# Patient Record
Sex: Female | Born: 1980 | Race: White | Hispanic: No | Marital: Married | Smoking: Current every day smoker
Health system: Southern US, Community
[De-identification: ages and names within clinical notes are randomized; demographics above are authoritative.]

## PROBLEM LIST (undated history)

## (undated) ENCOUNTER — Inpatient Hospital Stay (HOSPITAL_COMMUNITY): Payer: Self-pay

## (undated) ENCOUNTER — Inpatient Hospital Stay (HOSPITAL_COMMUNITY): Payer: Medicaid Other | Admitting: Obstetrics & Gynecology

## (undated) DIAGNOSIS — G43909 Migraine, unspecified, not intractable, without status migrainosus: Secondary | ICD-10-CM

## (undated) HISTORY — PX: CHOLECYSTECTOMY: SHX55

---

## 2013-05-06 ENCOUNTER — Emergency Department (HOSPITAL_BASED_OUTPATIENT_CLINIC_OR_DEPARTMENT_OTHER)
Admission: EM | Admit: 2013-05-06 | Discharge: 2013-05-06 | Disposition: A | Discharge status: Home / Self Care | Payer: Self-pay | Attending: Emergency Medicine | Admitting: Emergency Medicine

## 2013-05-06 ENCOUNTER — Encounter (HOSPITAL_BASED_OUTPATIENT_CLINIC_OR_DEPARTMENT_OTHER): Payer: Self-pay | Admitting: Emergency Medicine

## 2013-05-06 ENCOUNTER — Emergency Department (HOSPITAL_BASED_OUTPATIENT_CLINIC_OR_DEPARTMENT_OTHER): Payer: Self-pay

## 2013-05-06 DIAGNOSIS — G43909 Migraine, unspecified, not intractable, without status migrainosus: Secondary | ICD-10-CM | POA: Insufficient documentation

## 2013-05-06 DIAGNOSIS — Z79899 Other long term (current) drug therapy: Secondary | ICD-10-CM | POA: Insufficient documentation

## 2013-05-06 DIAGNOSIS — F17200 Nicotine dependence, unspecified, uncomplicated: Secondary | ICD-10-CM | POA: Insufficient documentation

## 2013-05-06 HISTORY — DX: Migraine, unspecified, not intractable, without status migrainosus: G43.909

## 2013-05-06 MED ORDER — HYDROCODONE-ACETAMINOPHEN 5-325 MG PO TABS: 1.0000 | ORAL_TABLET | ORAL | Status: DC | PRN

## 2013-05-06 MED ORDER — METOCLOPRAMIDE HCL 5 MG/ML IJ SOLN
10.0000 mg | Freq: Once | INTRAMUSCULAR | Status: AC
  Administered 2013-05-06: 10 mg via INTRAMUSCULAR
  Filled 2013-05-06: qty 2

## 2013-05-06 MED ORDER — KETOROLAC TROMETHAMINE 30 MG/ML IJ SOLN
30.0000 mg | Freq: Once | INTRAMUSCULAR | Status: DC
  Filled 2013-05-06: qty 1

## 2013-05-06 MED ORDER — KETOROLAC TROMETHAMINE 30 MG/ML IJ SOLN
30.0000 mg | Freq: Once | INTRAMUSCULAR | Status: AC
  Administered 2013-05-06: 30 mg via INTRAMUSCULAR

## 2013-05-06 MED ORDER — ONDANSETRON 4 MG PO TBDP
4.0000 mg | ORAL_TABLET | Freq: Once | ORAL | Status: AC
  Administered 2013-05-06: 4 mg via ORAL
  Filled 2013-05-06: qty 1

## 2013-05-06 MED ORDER — SUMATRIPTAN SUCCINATE 25 MG PO TABS: 25.0000 mg | ORAL_TABLET | ORAL | Status: DC | PRN

## 2013-05-06 MED ORDER — MORPHINE SULFATE 4 MG/ML IJ SOLN
8.0000 mg | Freq: Once | INTRAMUSCULAR | Status: AC
  Administered 2013-05-06: 8 mg via INTRAMUSCULAR
  Filled 2013-05-06 (×2): qty 2

## 2013-05-06 NOTE — ED Notes (Signed)
Patient states she has a one week history of a migraine headache.  States she has a history of headaches.  States one week ago when the headache started, it lasted for three days and resolved.  States the headache returned yesterday and has been associated with dizziness and vomiting.

## 2013-05-06 NOTE — ED Provider Notes (Addendum)
CSN: [id number]     Arrival date & time 05/06/13  1115 History   First MD Initiated Contact with Patient 05/06/13 1141     Chief Complaint  Patient presents with  . Migraine    HPI  Patient presents with headache for 3 days. She had headache week ago. Went away. She states it was "when the storm came through". She's had headaches for over 20 years described as "weather change headaches" in the left occipital and parietal. Not sinus related. He lateral left-sided throbbing retro-orbital left occiput. Slow in onset. It resolved. Recurred yesterday. This was again slow in onset. This is unusual for her and that her symptoms are worse although located in the same place in a similar nature they're more severe. No fever. No neck pain. She's never been evaluated by a physician. She states she has never been to the health care facility for headaches. She states she's never taken a prescription medication for her headaches. Strangely,  she has not tried any over-the-counter medication for this headache  Past Medical History  Diagnosis Date  . Migraine    Past Surgical History  Procedure Laterality Date  . Cholecystectomy     History reviewed. No pertinent family history. History  Substance Use Topics  . Smoking status: Current Some Day Smoker  . Smokeless tobacco: Not on file  . Alcohol Use: No   OB History   Grav Para Term Preterm Abortions TAB SAB Ect Mult Living                 Review of Systems  Constitutional: Negative for fever, chills, diaphoresis, appetite change and fatigue.  HENT: Negative for mouth sores, sore throat and trouble swallowing.   Eyes: Negative for visual disturbance.  Respiratory: Negative for cough, chest tightness, shortness of breath and wheezing.   Cardiovascular: Negative for chest pain.  Gastrointestinal: Negative for nausea, vomiting, abdominal pain, diarrhea and abdominal distention.  Endocrine: Negative for polydipsia, polyphagia and polyuria.   Genitourinary: Negative for dysuria, frequency and hematuria.  Musculoskeletal: Negative for gait problem.  Skin: Negative for color change, pallor and rash.  Neurological: Positive for headaches. Negative for dizziness, syncope and light-headedness.       Photosensitive  Hematological: Does not bruise/bleed easily.  Psychiatric/Behavioral: Negative for behavioral problems and confusion.    Allergies  Review of patient's allergies indicates no known allergies.  Home Medications   Current Outpatient Rx  Name  Route  Sig  Dispense  Refill  . HYDROcodone-acetaminophen (NORCO/VICODIN) 5-325 MG per tablet   Oral   Take 1 tablet by mouth every 4 (four) hours as needed for pain.   10 tablet   0   . ibuprofen (ADVIL,MOTRIN) 400 MG tablet   Oral   Take 400 mg by mouth every 6 (six) hours as needed for pain.         . SUMAtriptan (IMITREX) 25 MG tablet   Oral   Take 1 tablet (25 mg total) by mouth every 2 (two) hours as needed for migraine. May repeat in 2 hours if headache persists or recurs.   10 tablet   0    BP 120/70  Pulse 73  Temp(Src) 98.3 F (36.8 C)  Resp 20  Ht 5\' 10"  (1.778 m)  Wt 230 lb (104.327 kg)  BMI 33 kg/m2  SpO2 100%  LMP 04/27/2013 Physical Exam  Constitutional: She is oriented to person, place, and time. She appears well-developed and well-nourished. No distress.  HENT:  Head: Normocephalic.  Eyes: Conjunctivae are normal. Pupils are equal, round, and reactive to light. No scleral icterus.  Neck: Normal range of motion. Neck supple. No thyromegaly present.  Supple neck. No meningismus.  Cardiovascular: Normal rate and regular rhythm.  Exam reveals no gallop and no friction rub.   No murmur heard. Pulmonary/Chest: Effort normal and breath sounds normal. No respiratory distress. She has no wheezes. She has no rales.  Abdominal: Soft. Bowel sounds are normal. She exhibits no distension. There is no tenderness. There is no rebound.  Musculoskeletal:  Normal range of motion.  Neurological: She is alert and oriented to person, place, and time.  Normal neurological exam. Conversant. Cranial nerves intact symmetric.  Skin: Skin is warm and dry. No rash noted.  Psychiatric: She has a normal mood and affect. Her behavior is normal.    ED Course  Procedures (including critical care time) Labs Review Labs Reviewed - No data to display Imaging Review Ct Head Wo Contrast  05/06/2013   CLINICAL DATA:  Headache.  EXAM: CT HEAD WITHOUT CONTRAST  TECHNIQUE: Contiguous axial images were obtained from the base of the skull through the vertex without intravenous contrast.  COMPARISON:  None.  FINDINGS: Bony calvarium appears intact. No mass effect or midline shift is noted. Ventricular size is within normal limits. There is no evidence of mass lesion, hemorrhage or acute infarction.  IMPRESSION: No gross intracranial abnormality seen.   Electronically Signed   By: Roque Lias M.D.   On: 05/06/2013 12:45    EKG Interpretation   None     Sinus bradycardia.  No st/t changes.  No ectopy   MDM   1. Headache   2. Migraine    Normal imaging. Description of the headache is unilateral in her photosensitivity and indolent onset somewhat migraine. Did not have a high suspicion that this is vascular or subarachnoid hemorrhage. Afebrile, supple neck, not meningitis. Plan is home. Recheck with recurrence or evolving symptoms.    Roney Marion, MD 05/06/13 1407  Roney Marion, MD 05/08/13 [phone]

## 2013-05-06 NOTE — ED Notes (Signed)
Patient transported to CT 

## 2014-11-27 ENCOUNTER — Emergency Department (HOSPITAL_BASED_OUTPATIENT_CLINIC_OR_DEPARTMENT_OTHER)
Admission: EM | Admit: 2014-11-27 | Discharge: 2014-11-27 | Disposition: A | Discharge status: Home / Self Care | Payer: Medicaid Other | Attending: Emergency Medicine | Admitting: Emergency Medicine

## 2014-11-27 ENCOUNTER — Encounter (HOSPITAL_BASED_OUTPATIENT_CLINIC_OR_DEPARTMENT_OTHER): Payer: Self-pay

## 2014-11-27 DIAGNOSIS — R102 Pelvic and perineal pain: Secondary | ICD-10-CM | POA: Insufficient documentation

## 2014-11-27 DIAGNOSIS — N938 Other specified abnormal uterine and vaginal bleeding: Secondary | ICD-10-CM | POA: Diagnosis not present

## 2014-11-27 DIAGNOSIS — N939 Abnormal uterine and vaginal bleeding, unspecified: Secondary | ICD-10-CM

## 2014-11-27 DIAGNOSIS — Z3202 Encounter for pregnancy test, result negative: Secondary | ICD-10-CM | POA: Diagnosis not present

## 2014-11-27 DIAGNOSIS — Z720 Tobacco use: Secondary | ICD-10-CM | POA: Insufficient documentation

## 2014-11-27 DIAGNOSIS — Z8679 Personal history of other diseases of the circulatory system: Secondary | ICD-10-CM | POA: Diagnosis not present

## 2014-11-27 DIAGNOSIS — R109 Unspecified abdominal pain: Secondary | ICD-10-CM | POA: Diagnosis present

## 2014-11-27 DIAGNOSIS — Z9049 Acquired absence of other specified parts of digestive tract: Secondary | ICD-10-CM | POA: Insufficient documentation

## 2014-11-27 LAB — CBC WITH DIFFERENTIAL/PLATELET
Basophils Absolute: 0 10*3/uL (ref 0.0–0.1)
Basophils Relative: 0 % (ref 0–1)
EOS ABS: 0.1 10*3/uL (ref 0.0–0.7)
Eosinophils Relative: 2 % (ref 0–5)
HCT: 40.7 % (ref 36.0–46.0)
Hemoglobin: 13.5 g/dL (ref 12.0–15.0)
Lymphocytes Relative: 43 % (ref 12–46)
Lymphs Abs: 2.6 10*3/uL (ref 0.7–4.0)
MCH: 29.6 pg (ref 26.0–34.0)
MCHC: 33.2 g/dL (ref 30.0–36.0)
MCV: 89.3 fL (ref 78.0–100.0)
Monocytes Absolute: 0.4 10*3/uL (ref 0.1–1.0)
Monocytes Relative: 6 % (ref 3–12)
NEUTROS ABS: 2.9 10*3/uL (ref 1.7–7.7)
Neutrophils Relative %: 49 % (ref 43–77)
Platelets: 227 10*3/uL (ref 150–400)
RBC: 4.56 MIL/uL (ref 3.87–5.11)
RDW: 12.8 % (ref 11.5–15.5)
WBC: 6 10*3/uL (ref 4.0–10.5)

## 2014-11-27 LAB — URINE MICROSCOPIC-ADD ON

## 2014-11-27 LAB — COMPREHENSIVE METABOLIC PANEL
ALT: 14 U/L (ref 14–54)
AST: 16 U/L (ref 15–41)
Albumin: 4.3 g/dL (ref 3.5–5.0)
Alkaline Phosphatase: 29 U/L — ABNORMAL LOW (ref 38–126)
Anion gap: 9 (ref 5–15)
BUN: 11 mg/dL (ref 6–20)
CO2: 23 mmol/L (ref 22–32)
CREATININE: 0.82 mg/dL (ref 0.44–1.00)
Calcium: 8.7 mg/dL — ABNORMAL LOW (ref 8.9–10.3)
Chloride: 109 mmol/L (ref 101–111)
GFR calc Af Amer: >60 mL/min (ref >60)
GFR calc non Af Amer: >60 mL/min (ref >60)
GLUCOSE: 89 mg/dL (ref 65–99)
POTASSIUM: 3.7 mmol/L (ref 3.5–5.1)
SODIUM: 141 mmol/L (ref 135–145)
Total Bilirubin: 0.8 mg/dL (ref 0.3–1.2)
Total Protein: 7.4 g/dL (ref 6.5–8.1)

## 2014-11-27 LAB — URINALYSIS, ROUTINE W REFLEX MICROSCOPIC
GLUCOSE, UA: NEGATIVE mg/dL
KETONES UR: NEGATIVE mg/dL
Leukocytes, UA: NEGATIVE
NITRITE: NEGATIVE
Protein, ur: 30 mg/dL — AB
Specific Gravity, Urine: 1.028 (ref 1.005–1.030)
Urobilinogen, UA: 0.2 mg/dL (ref 0.0–1.0)
pH: 5.5 (ref 5.0–8.0)

## 2014-11-27 LAB — PREGNANCY, URINE: PREG TEST UR: NEGATIVE

## 2014-11-27 MED ORDER — KETOROLAC TROMETHAMINE 30 MG/ML IJ SOLN
30.0000 mg | Freq: Once | INTRAMUSCULAR | Status: DC
  Filled 2014-11-27: qty 1

## 2014-11-27 MED ORDER — KETOROLAC TROMETHAMINE 30 MG/ML IJ SOLN: 30.0000 mg | Freq: Once | INTRAMUSCULAR | Status: DC

## 2014-11-27 MED ORDER — IBUPROFEN 800 MG PO TABS: 800.0000 mg | ORAL_TABLET | Freq: Three times a day (TID) | ORAL | Status: DC

## 2014-11-27 MED ORDER — SODIUM CHLORIDE 0.9 % IV BOLUS (SEPSIS)
1000.0000 mL | Freq: Once | INTRAVENOUS | Status: AC
Start: 2014-11-27 — End: 2014-11-27
  Administered 2014-11-27: 1000 mL via INTRAVENOUS

## 2014-11-27 MED ORDER — KETOROLAC TROMETHAMINE 60 MG/2ML IM SOLN
60.0000 mg | Freq: Once | INTRAMUSCULAR | Status: AC
  Administered 2014-11-27: 60 mg via INTRAMUSCULAR
  Filled 2014-11-27: qty 2

## 2014-11-27 MED ORDER — MORPHINE SULFATE 4 MG/ML IJ SOLN
4.0000 mg | Freq: Once | INTRAMUSCULAR | Status: AC
  Administered 2014-11-27: 4 mg via INTRAVENOUS
  Filled 2014-11-27: qty 1

## 2014-11-27 NOTE — ED Notes (Addendum)
Pt given urine cup for sample collection, unable to collect urine, will reattempt when next able.

## 2014-11-27 NOTE — ED Notes (Signed)
Patient here with heavy irregular vaginal bleeding x 3 days. Reports that she started re-bleeding 4 days after regular menstrual cycle.  Also complains of severe lower abdominal pain

## 2014-11-27 NOTE — ED Provider Notes (Signed)
CSN: [id number]     Arrival date & time 11/27/14  1309 History  This chart was scribed for Haley Canalavid H Weda Baumgarner, MD by Abel PrestoKara Demonbreun, ED Scribe. This patient was seen in room MH10/MH10 and the patient's care was started at 3:28 PM.    Chief Complaint  Patient presents with  . Abdominal Pain    The history is provided by the patient. No language interpreter was used.   HPI Comments: Haley Greer is a 34 y.o. female  who presents to the Emergency Department complaining of irregular vaginal bleeding with onset 3 days ago. She is unsure of how many pads she has used since onset. Pt's LNMP ended 6 days ago. She notes associated pelvic pain and cramping. Pt had a D&C in December and reports some spotting post-menses each month since. Pt took a home pregnancy test with negative results. Pt is not on birth control currently.  Pt denies vaginal discharge and fever.  Past Medical History  Diagnosis Date  . Migraine    Past Surgical History  Procedure Laterality Date  . Cholecystectomy     No family history on file. History  Substance Use Topics  . Smoking status: Current Some Day Smoker  . Smokeless tobacco: Not on file  . Alcohol Use: No   OB History    No data available     Review of Systems  Constitutional: Negative for fever.  Genitourinary: Positive for vaginal bleeding and pelvic pain. Negative for vaginal discharge.      Allergies  Review of patient's allergies indicates no known allergies.  Home Medications   Prior to Admission medications   Medication Sig Start Date End Date Taking? Authorizing Provider  ibuprofen (ADVIL,MOTRIN) 400 MG tablet Take 400 mg by mouth every 6 (six) hours as needed for pain.    Historical Provider, MD   BP 100/61 mmHg  Pulse 55  Temp(Src) 98.1 F (36.7 C) (Oral)  Resp 18  Ht 5\' 11"  (1.803 m)  Wt 198 lb (89.812 kg)  BMI 27.63 kg/m2  SpO2 97%  LMP 11/21/2014 Physical Exam  Constitutional: She is oriented to person, place, and time.  She appears well-developed and well-nourished.  HENT:  Head: Normocephalic.  Eyes: Conjunctivae are normal.  Neck: Normal range of motion. Neck supple.  Cardiovascular: Normal rate, regular rhythm, normal heart sounds and intact distal pulses.  Exam reveals no friction rub.   No murmur heard. Pulmonary/Chest: Effort normal and breath sounds normal. No respiratory distress.  Abdominal: Soft. Bowel sounds are normal. There is tenderness.  Mild uterine tenderness  Genitourinary:  Blood in vaginal vault. Minimal uterine tenderness. No adnexal tenderness   Musculoskeletal: Normal range of motion.  Neurological: She is alert and oriented to person, place, and time.  Skin: Skin is warm and dry.  Psychiatric: She has a normal mood and affect. Her behavior is normal.  Nursing note and vitals reviewed.   ED Course  Procedures (including critical care time) DIAGNOSTIC STUDIES: Oxygen Saturation is 100% on room air, normal by my interpretation.    COORDINATION OF CARE: 3:33 PM Discussed treatment plan with patient at beside, the patient agrees with the plan and has no further questions at this time.   Labs Review Labs Reviewed  URINALYSIS, ROUTINE W REFLEX MICROSCOPIC - Abnormal; Notable for the following:    Color, Urine AMBER (*)    Hgb urine dipstick LARGE (*)    Bilirubin Urine SMALL (*)    Protein, ur 30 (*)  All other components within normal limits  COMPREHENSIVE METABOLIC PANEL - Abnormal; Notable for the following:    Calcium 8.7 (*)    Alkaline Phosphatase 29 (*)    All other components within normal limits  URINE MICROSCOPIC-ADD ON - Abnormal; Notable for the following:    Bacteria, UA MANY (*)    All other components within normal limits  PREGNANCY, URINE  CBC WITH DIFFERENTIAL/PLATELET    Imaging Review No results found.   EKG Interpretation None      MDM   Final diagnoses:  None   Haley Greer is a 34 y.o. female here with break through  bleeding. Hg stable at 13. Vitals stable. UCG neg. I think likely having cramps. Recommend OCP to help regulate cycle. Motrin prn cramps.   I personally performed the services described in this documentation, which was scribed in my presence. The recorded information has been reviewed and is accurate.    Haley Canalavid H Ajeet Casasola, MD 11/27/14 [phone]

## 2014-11-27 NOTE — Discharge Instructions (Signed)
Take motrin 800 mg every 6 hrs for pain.   You should be started on oral contraceptives to regulate your cycle.   See your GYN doctor.   Return to ER if you have severe pain, worse bleeding, vomiting, fever.

## 2015-01-15 ENCOUNTER — Emergency Department (HOSPITAL_BASED_OUTPATIENT_CLINIC_OR_DEPARTMENT_OTHER)
Admission: EM | Admit: 2015-01-15 | Discharge: 2015-01-15 | Disposition: A | Discharge status: Home / Self Care | Payer: Medicaid Other | Attending: Emergency Medicine | Admitting: Emergency Medicine

## 2015-01-15 ENCOUNTER — Encounter (HOSPITAL_BASED_OUTPATIENT_CLINIC_OR_DEPARTMENT_OTHER): Payer: Self-pay | Admitting: *Deleted

## 2015-01-15 ENCOUNTER — Emergency Department (HOSPITAL_BASED_OUTPATIENT_CLINIC_OR_DEPARTMENT_OTHER): Payer: Medicaid Other

## 2015-01-15 DIAGNOSIS — Z8679 Personal history of other diseases of the circulatory system: Secondary | ICD-10-CM | POA: Diagnosis not present

## 2015-01-15 DIAGNOSIS — O209 Hemorrhage in early pregnancy, unspecified: Secondary | ICD-10-CM

## 2015-01-15 DIAGNOSIS — O418X11 Other specified disorders of amniotic fluid and membranes, first trimester, fetus 1: Secondary | ICD-10-CM | POA: Diagnosis not present

## 2015-01-15 DIAGNOSIS — F17210 Nicotine dependence, cigarettes, uncomplicated: Secondary | ICD-10-CM | POA: Insufficient documentation

## 2015-01-15 DIAGNOSIS — Z3A01 Less than 8 weeks gestation of pregnancy: Secondary | ICD-10-CM | POA: Insufficient documentation

## 2015-01-15 DIAGNOSIS — O99331 Smoking (tobacco) complicating pregnancy, first trimester: Secondary | ICD-10-CM | POA: Diagnosis not present

## 2015-01-15 DIAGNOSIS — O468X1 Other antepartum hemorrhage, first trimester: Secondary | ICD-10-CM

## 2015-01-15 DIAGNOSIS — Z23 Encounter for immunization: Secondary | ICD-10-CM | POA: Insufficient documentation

## 2015-01-15 DIAGNOSIS — O418X10 Other specified disorders of amniotic fluid and membranes, first trimester, not applicable or unspecified: Secondary | ICD-10-CM

## 2015-01-15 DIAGNOSIS — Z791 Long term (current) use of non-steroidal anti-inflammatories (NSAID): Secondary | ICD-10-CM | POA: Diagnosis not present

## 2015-01-15 LAB — URINALYSIS, ROUTINE W REFLEX MICROSCOPIC
Bilirubin Urine: NEGATIVE
Glucose, UA: NEGATIVE mg/dL
Ketones, ur: NEGATIVE mg/dL
Leukocytes, UA: NEGATIVE
Nitrite: NEGATIVE
Protein, ur: 30 mg/dL — AB
Specific Gravity, Urine: 1.027 (ref 1.005–1.030)
UROBILINOGEN UA: 0.2 mg/dL (ref 0.0–1.0)
pH: 6 (ref 5.0–8.0)

## 2015-01-15 LAB — URINE MICROSCOPIC-ADD ON

## 2015-01-15 LAB — PREGNANCY, URINE: Preg Test, Ur: POSITIVE — AB

## 2015-01-15 MED ORDER — RHO D IMMUNE GLOBULIN 1500 UNIT/2ML IJ SOSY
PREFILLED_SYRINGE | INTRAMUSCULAR | Status: AC
  Filled 2015-01-15: qty 2

## 2015-01-15 MED ORDER — RHO D IMMUNE GLOBULIN 1500 UNIT/2ML IJ SOSY
300.0000 ug | PREFILLED_SYRINGE | Freq: Once | INTRAMUSCULAR | Status: AC
  Administered 2015-01-15: 300 ug via INTRAMUSCULAR

## 2015-01-15 NOTE — ED Notes (Signed)
Pt reports LMP around April- states vaginal bleeding and back pain started approx 1 hour pta

## 2015-01-15 NOTE — ED Provider Notes (Signed)
CSN: [id number]     Arrival date & time 01/15/15  1402 History  This chart was scribed for Haley Sprout, MD by Octavia Heir, ED Scribe. This patient was seen in room MH02/MH02 and the patient's care was started at 3:09 PM.    Chief Complaint  Patient presents with  . Vaginal Bleeding     HPI  HPI Comments: Haley Greer is a 34 y.o. female who presents to the Emergency Department complaining of vaginal bleeding onset about 2 hours ago. Pt has associated lower abdomen and back pain that she describes as period cramps. She notes every time she stands, she can feel the blood, like she is on her period. Pt notes she has a D&C in December and lost the baby at 5 months. She notes her other 3 pregnancies were normal. Pt was not using any birth control when she became pregnant. She notes having sexual intercourse this morning around 9 am and denies pain. Pt denies bleeding early in previous pregnancies and burning while urinating. She notes her last period was before Mother's Day. Pt is O- blood type. She notes having Rhogam in all of her pregnancies.  Past Medical History  Diagnosis Date  . Migraine    Past Surgical History  Procedure Laterality Date  . Cholecystectomy     No family history on file. History  Substance Use Topics  . Smoking status: Current Some Day Smoker    Types: Cigarettes  . Smokeless tobacco: Never Used  . Alcohol Use: No   OB History    No data available     Review of Systems  A complete 10 system review of systems was obtained and all systems are negative except as noted in the HPI and PMH.    Allergies  Review of patient's allergies indicates no known allergies.  Home Medications   Prior to Admission medications   Medication Sig Start Date End Date Taking? Authorizing Provider  Prenatal Vit-Fe Fumarate-FA (PRENATAL VITAMIN PO) Take by mouth.   Yes Historical Provider, MD  ibuprofen (ADVIL,MOTRIN) 800 MG tablet Take 1 tablet (800 mg total) by  mouth 3 (three) times daily. 11/27/14   Richardean Canal, MD   Triage vitals: BP 121/79 mmHg  Pulse 72  Temp(Src) 98.7 F (37.1 C) (Oral)  Resp 20  SpO2 97%  LMP  (LMP Unknown) Physical Exam  Constitutional: She is oriented to person, place, and time. She appears well-developed and well-nourished. No distress.  HENT:  Head: Normocephalic.  Eyes: Conjunctivae are normal. Pupils are equal, round, and reactive to light. No scleral icterus.  Neck: Normal range of motion. Neck supple. No thyromegaly present.  Cardiovascular: Normal rate, regular rhythm and normal heart sounds.  Exam reveals no gallop and no friction rub.   No murmur heard. Pulmonary/Chest: Effort normal and breath sounds normal. No respiratory distress. She has no wheezes. She has no rales.  Abdominal: Soft. Bowel sounds are normal. She exhibits no distension. There is no tenderness. There is no rebound.  Genitourinary: Uterus is enlarged. Cervix exhibits no motion tenderness. Right adnexum displays no tenderness. Left adnexum displays no tenderness. There is bleeding in the vagina.  Dark blood present in the vaginal vault without clot  Musculoskeletal: Normal range of motion.  Neurological: She is alert and oriented to person, place, and time.  Skin: Skin is warm and dry. No rash noted.  Psychiatric: She has a normal mood and affect. Her behavior is normal.  Nursing note and vitals reviewed.  ED Course  Procedures  DIAGNOSTIC STUDIES: Oxygen Saturation is 97% on RA, normal by my interpretation.  COORDINATION OF CARE:  3:15 PM Discussed treatment plan which includes US with pt at bedside and pt agreed to plan.  Labs Review Labs Reviewed  PREGNANCY, URINE - Abnormal; Notable for the following:    Preg Test, Ur POSITIVE (*)    All other components within normal limits  URINALYSIS, ROUTINE W REFLEX MICROSCOPIC (NOT AT [facility]) - Abnormal; Notable for the following:    Color, Urine AMBER (*)    APPearance CLOUDY (*)    Hgb  urine dipstick LARGE (*)    Protein, ur 30 (*)    All other components within normal limits  URINE MICROSCOPIC-ADD ON - Abnormal; Notable for the following:    Bacteria, UA MANY (*)    All other components within normal limits  WET PREP, GENITAL  HCG, QUANTITATIVE, PREGNANCY  ABO/RH  GC/CHLAMYDIA PROBE AMP ([place]) NOT AT [facility]    Imaging Review [place] Ob Comp Less 14 Wks  01/15/2015   CLINICAL DATA:  Heavy vaginal bleeding today. Pelvic cramping. History of previous abortion with D and C.  EXAM: OBSTETRIC <14 WK US AND TRANSVAGINAL OB US  TECHNIQUE: Both transabdominal and transvaginal ultrasound examinations were performed for complete evaluation of the gestation as well as the maternal uterus, adnexal regions, and pelvic cul-de-sac. Transvaginal technique was performed to assess early pregnancy.  COMPARISON:  None.  FINDINGS: Intrauterine gestational sac: Visualized/normal in shape.  Yolk sac:  Embryo:  Cardiac Activity:  Heart Rate:   bpm  CRL:  9  mm   6 w   6 d                  US EDC: 09/04/2015  There is moderate subchorionic hemorrhage.  Maternal uterus/adnexae: The uterus is retroflexed. Small amount of fluid in the cervical canal. No free fluid. Right ovary measures 2.6 x 3.5 x 2.3 cm and appears normal. Left ovary measures 3.2 by 3.7 x 2.5 cm and appears normal.  IMPRESSION: Single living intrauterine pregnancy at 6 weeks 6 days by crown-rump length. Moderate subchorionic hemorrhage.   Electronically Signed   By: Paulina FusiMark  Shogry M.D.   On: 01/15/2015 16:13   [place] Ob Transvaginal  01/15/2015   CLINICAL DATA:  Heavy vaginal bleeding today. Pelvic cramping. History of previous abortion with D and C.  EXAM: OBSTETRIC <14 WK US AND TRANSVAGINAL OB US  TECHNIQUE: Both transabdominal and transvaginal ultrasound examinations were performed for complete evaluation of the gestation as well as the maternal uterus, adnexal regions, and pelvic cul-de-sac. Transvaginal technique was performed to assess early  pregnancy.  COMPARISON:  None.  FINDINGS: Intrauterine gestational sac: Visualized/normal in shape.  Yolk sac:  Embryo:  Cardiac Activity:  Heart Rate:   bpm  CRL:  9  mm   6 w   6 d                  US EDC: 09/04/2015  There is moderate subchorionic hemorrhage.  Maternal uterus/adnexae: The uterus is retroflexed. Small amount of fluid in the cervical canal. No free fluid. Right ovary measures 2.6 x 3.5 x 2.3 cm and appears normal. Left ovary measures 3.2 by 3.7 x 2.5 cm and appears normal.  IMPRESSION: Single living intrauterine pregnancy at 6 weeks 6 days by crown-rump length. Moderate subchorionic hemorrhage.   Electronically Signed   By: Paulina FusiMark  Shogry M.D.   On: 01/15/2015 16:13  EKG Interpretation None      MDM   Final diagnoses:  None   [id number] who presents today in the first trimester of pregnancy who developed bleeding at 1pm today.  She states when she stands up she can peel it running out. She feels that it is not as heavy as a period but does have some lower abdominal and back cramping.  She last had intercourse this morning but denies any pain. No prior bleeding with earlier pregnancies. Patient is O- and has received program with all of her prior pregnancies. On bedside ultrasound gestational sac was seen however because of early pregnancy will need a transvaginal ultrasound to confirm IUP. Vital signs are within normal limits. Patient is otherwise healthy female.  On pelvic exam patient has dark bleeding without clots present. No cervical motion tenderness or ovarian tenderness. Ultrasound shows a 6 week 6 day single live IUP. With a moderate subchorionic hemorrhage which is most likely the cause of her bleeding today. Patient was given rhogam. Discussed With patient the possibility of miscarriage versus carrying a child full-term. It gave her strict return precautions and she will follow-up with OB/GYN. I personally performed the services described in this documentation, which was  scribed in my presence.  The recorded information has been reviewed and considered.    Neill Jurewicz PlGwyneth Greer/02/16 [phone]

## 2015-01-15 NOTE — Discharge Instructions (Signed)
Pelvic Rest °Pelvic rest is sometimes recommended for women when:  °· The placenta is partially or completely covering the opening of the cervix (placenta previa). °· There is bleeding between the uterine wall and the amniotic sac in the first trimester (subchorionic hemorrhage). °· The cervix begins to open without labor starting (incompetent cervix, cervical insufficiency). °· The labor is too early (preterm labor). °HOME CARE INSTRUCTIONS °· Do not have sexual intercourse, stimulation, or an orgasm. °· Do not use tampons, douche, or put anything in the vagina. °· Do not lift anything over 10 pounds (4.5 kg). °· Avoid strenuous activity or straining your pelvic muscles. °SEEK MEDICAL CARE IF:  °· You have any vaginal bleeding during pregnancy. Treat this as a potential emergency. °· You have cramping pain felt low in the stomach (stronger than menstrual cramps). °· You notice vaginal discharge (watery, mucus, or bloody). °· You have a low, dull backache. °· There are regular contractions or uterine tightening. °SEEK IMMEDIATE MEDICAL CARE IF: °You have vaginal bleeding and have placenta previa.  °Document Released: 10/27/2010 Document Revised: 09/24/2011 Document Reviewed: 10/27/2010 °Exit[employer]® Patient Information ©2015 [employer]. This information is not intended to replace advice given to you by your health care provider. Make sure you discuss any questions you have with your health care provider. ° °Subchorionic Hematoma °A subchorionic hematoma is a gathering of blood between the outer wall of the placenta and the inner wall of the womb (uterus). The placenta is the organ that connects the fetus to the wall of the uterus. The placenta performs the feeding, breathing (oxygen to the fetus), and waste removal (excretory work) of the fetus.  °Subchorionic hematoma is the most common abnormality found on a result from ultrasonography done during the first trimester or early second trimester of pregnancy. If  there has been little or no vaginal bleeding, early small hematomas usually shrink on their own and do not affect your baby or pregnancy. The blood is gradually absorbed over 1-2 weeks. When bleeding starts later in pregnancy or the hematoma is larger or occurs in an older pregnant woman, the outcome may not be as good. Larger hematomas may get bigger, which increases the chances for miscarriage. Subchorionic hematoma also increases the risk of premature detachment of the placenta from the uterus, preterm (premature) labor, and stillbirth. °HOME CARE INSTRUCTIONS °· Stay on bed rest if your health care provider recommends this. Although bed rest will not prevent more bleeding or prevent a miscarriage, your health care provider may recommend bed rest until you are advised otherwise. °· Avoid heavy lifting (more than 10 lb [4.5 kg]), exercise, sexual intercourse, or douching as directed by your health care provider. °· Keep track of the number of pads you use each day and how soaked (saturated) they are. Write down this information. °· Do not use tampons. °· Keep all follow-up appointments as directed by your health care provider. Your health care provider may ask you to have follow-up blood tests or ultrasound tests or both. °SEEK IMMEDIATE MEDICAL CARE IF: °· You have severe cramps in your stomach, back, abdomen, or pelvis. °· You have a fever. °· You pass large clots or tissue. Save any tissue for your health care provider to look at. °· Your bleeding increases or you become lightheaded, feel weak, or have fainting episodes. °Document Released: 10/17/2006 Document Revised: 11/16/2013 Document Reviewed: 01/29/2013 °ExitCare® Patient Information ©2015 [employer]. This information is not intended to replace advice given to you by your health care provider.   Make sure you discuss any questions you have with your health care provider. ° °

## 2015-01-15 NOTE — ED Notes (Signed)
Here for vaginal bleeding, states onset at approx 1pm today, denies any clots in blood noted, states bleeding occurs when standing.

## 2015-02-03 MED FILL — Rho D Immune Globulin Sol Pref Syr 1500 Unt/2ML (300MCG/2ML): INTRAMUSCULAR | Qty: 2 | Status: AC

## 2015-02-05 ENCOUNTER — Encounter (HOSPITAL_BASED_OUTPATIENT_CLINIC_OR_DEPARTMENT_OTHER): Payer: Self-pay | Admitting: Emergency Medicine

## 2015-02-05 ENCOUNTER — Inpatient Hospital Stay (HOSPITAL_BASED_OUTPATIENT_CLINIC_OR_DEPARTMENT_OTHER)
Admission: AD | Admit: 2015-02-05 | Discharge: 2015-02-06 | Disposition: A | Discharge status: Home / Self Care | Payer: Medicaid Other | Attending: Obstetrics & Gynecology | Admitting: Obstetrics & Gynecology

## 2015-02-05 DIAGNOSIS — O021 Missed abortion: Secondary | ICD-10-CM | POA: Insufficient documentation

## 2015-02-05 DIAGNOSIS — Z3A10 10 weeks gestation of pregnancy: Secondary | ICD-10-CM | POA: Insufficient documentation

## 2015-02-05 DIAGNOSIS — O209 Hemorrhage in early pregnancy, unspecified: Secondary | ICD-10-CM

## 2015-02-05 NOTE — ED Notes (Signed)
Pt reports note vag bleeding spotting intermittant since last visit to ED and has had increased discomfort today

## 2015-02-06 ENCOUNTER — Inpatient Hospital Stay (HOSPITAL_COMMUNITY): Payer: Medicaid Other

## 2015-02-06 ENCOUNTER — Encounter (HOSPITAL_COMMUNITY): Payer: Self-pay | Admitting: *Deleted

## 2015-02-06 DIAGNOSIS — O021 Missed abortion: Secondary | ICD-10-CM

## 2015-02-06 DIAGNOSIS — Z3A10 10 weeks gestation of pregnancy: Secondary | ICD-10-CM | POA: Diagnosis not present

## 2015-02-06 DIAGNOSIS — O26851 Spotting complicating pregnancy, first trimester: Secondary | ICD-10-CM | POA: Diagnosis present

## 2015-02-06 LAB — CBC WITH DIFFERENTIAL/PLATELET
Basophils Absolute: 0 10*3/uL (ref 0.0–0.1)
Basophils Relative: 0 % (ref 0–1)
EOS PCT: 2 % (ref 0–5)
Eosinophils Absolute: 0.2 10*3/uL (ref 0.0–0.7)
HEMATOCRIT: 37.3 % (ref 36.0–46.0)
HEMOGLOBIN: 12.3 g/dL (ref 12.0–15.0)
LYMPHS ABS: 2.9 10*3/uL (ref 0.7–4.0)
Lymphocytes Relative: 39 % (ref 12–46)
MCH: 29.2 pg (ref 26.0–34.0)
MCHC: 33 g/dL (ref 30.0–36.0)
MCV: 88.6 fL (ref 78.0–100.0)
MONOS PCT: 6 % (ref 3–12)
Monocytes Absolute: 0.4 10*3/uL (ref 0.1–1.0)
Neutro Abs: 4 10*3/uL (ref 1.7–7.7)
Neutrophils Relative %: 53 % (ref 43–77)
Platelets: 181 10*3/uL (ref 150–400)
RBC: 4.21 MIL/uL (ref 3.87–5.11)
RDW: 12.8 % (ref 11.5–15.5)
WBC: 7.5 10*3/uL (ref 4.0–10.5)

## 2015-02-06 LAB — URINALYSIS, ROUTINE W REFLEX MICROSCOPIC
BILIRUBIN URINE: NEGATIVE
GLUCOSE, UA: NEGATIVE mg/dL
Hgb urine dipstick: NEGATIVE
KETONES UR: NEGATIVE mg/dL
Leukocytes, UA: NEGATIVE
Nitrite: NEGATIVE
PH: 5.5 (ref 5.0–8.0)
PROTEIN: NEGATIVE mg/dL
Specific Gravity, Urine: >1.03 — ABNORMAL HIGH (ref 1.005–1.030)
Urobilinogen, UA: 0.2 mg/dL (ref 0.0–1.0)

## 2015-02-06 LAB — HCG, QUANTITATIVE, PREGNANCY: HCG, BETA CHAIN, QUANT, S: 16187 m[IU]/mL — AB (ref <5)

## 2015-02-06 MED ORDER — PROMETHAZINE HCL 12.5 MG PO TABS: 12.5000 mg | ORAL_TABLET | Freq: Four times a day (QID) | ORAL | Status: DC | PRN

## 2015-02-06 MED ORDER — MISOPROSTOL 200 MCG PO TABS: 800.0000 ug | ORAL_TABLET | Freq: Once | ORAL | Status: DC

## 2015-02-06 MED ORDER — HYDROCODONE-ACETAMINOPHEN 5-325 MG PO TABS: 1.0000 | ORAL_TABLET | Freq: Four times a day (QID) | ORAL | Status: DC | PRN

## 2015-02-06 MED ORDER — IBUPROFEN 600 MG PO TABS: 600.0000 mg | ORAL_TABLET | Freq: Four times a day (QID) | ORAL | Status: DC | PRN

## 2015-02-06 NOTE — MAU Provider Note (Signed)
History     CSN: [id number]  Arrival date and time: 02/05/15 2217   First Provider Initiated Contact with Patient 02/06/15 0058      Chief Complaint  Patient presents with  . Vaginal Bleeding   HPI Haley Greer 34 y.o. [id number]  presents to MAU complaining of abdominal and back pain with nausea and spotting.  She was seen July 2 at [facility] for vaginal bleeding and noted to have subchorionic hemorrhage.  She had regular light bleeding for over a week and then stopped spotting for 5- 6 days before 7-8 hours ago when she started spotting a bright red blood.   The abdominal pain and back pain started yesterday morning.  It is not as bad now as it was July 2nd.  The nausea has been ongoing all day.  She denies vomiting.  She was able to eat at 4pm yesterday but did not change the nausea or abdominal pain.  She denies dysuria, weakness, chest pain or shortness of breath.  She does have a headache, 5/10.  She has not used any medication to treat her symptoms and does not want any now.   OB History  Gravida Para Term Preterm AB SAB TAB Ectopic Multiple Living  # Outcome Date GA Lbr Len/2nd Weight Sex Delivery Anes PTL Lv  5 Current           4 SAB 06/29/14        FD  3 Term 05/27/11    F Vag-Spont   Y  2 Term 09/30/06    Genella Mech   Y  1 Term 04/06/03    F Vag-Spont   Y      Allergies: No Known Allergies  Prescriptions prior to admission  Medication Sig Dispense Refill Last Dose  . Prenatal Vit-Fe Fumarate-FA (PRENATAL MULTIVITAMIN) TABS tablet Take 1 tablet by mouth daily at 12 noon.     Marland Kitchen ibuprofen (ADVIL,MOTRIN) 800 MG tablet Take 1 tablet (800 mg total) by mouth 3 (three) times daily. 21 tablet 0   . Prenatal Vit-Fe Fumarate-FA (PRENATAL VITAMIN PO) Take by mouth.       ROS Pertinent ROS in HPI.  All other systems are negative.     Physical Exam   Blood pressure 114/69, pulse 77, temperature 98.1 F (36.7 C), temperature source Oral,  resp. rate 18, height  (1.803 m), weight 198 lb 3.2 oz (89.903 kg), SpO2 100 %.  Physical Exam  Constitutional: She is oriented to person, place, and time. She appears well-developed and well-nourished. No distress.  HENT:  Head: Normocephalic and atraumatic.  Eyes: EOM are normal.  Neck: Normal range of motion.  Cardiovascular: Normal rate and normal heart sounds.   Respiratory: Effort normal and breath sounds normal. No respiratory distress.  GI: Soft. Bowel sounds are normal. She exhibits no distension. There is no tenderness. There is no rebound.  Genitourinary:  Scant white thin discharge in vault.   No visible blood  Cervix is closed  Musculoskeletal: Normal range of motion.  Neurological: She is alert and oriented to person, place, and time.  Skin: Skin is warm and dry.  Psychiatric: She has a normal mood and affect.    MAU Course  Procedures  MDM Discussed with Dr. Debroah Loop   Pt does not want expectant management.  MD agreeable to Cytotec at home after discharge to home.   Assessment and Plan  A: Missed AB  P: Discharge to home     Early Intrauterine Pregnancy Failure  _x__  Documented intrauterine pregnancy failure less than or equal to [redacted] weeks gestation  _x__  No serious current illness  x___  Baseline Hgb greater than or equal to 10g/dl  _x__  Patient has easily accessible transportation to the hospital  __x_  Clear preference  _x__  Practitioner/physician deems patient reliable  _x__  Counseling by practitioner or physician  _x__  Patient education by RN  ___  Rho-Gam given by RN if indicated:  Pt just had Rhogam on 01/15/15.    ___ Medication dispensed   _x__   Cytotec 800 mcg  _x_   Intravaginally by patient at home  _x__  Ibuprofen 600 mg 1 tablet by mouth every 6 hours as needed #30  _x__  Hydrocodone/acetaminophen 5/325 mg by mouth every 4 to 6 hours as needed  _x__  Phenergan 12.5 mg by mouth every 4 hours as needed for  nausea     Bertram Denver 02/06/2015, 12:59 AM

## 2015-02-06 NOTE — Progress Notes (Signed)
Nada Maclachlan PA in to discuss test results and d/c plan. Pt agrees to cytotec at home and permit signed. WRitten and verbal d/c instructions given and understanding voiced.

## 2015-02-06 NOTE — Discharge Instructions (Signed)
Incomplete Miscarriage A miscarriage is the sudden loss of an unborn baby (fetus) before the 20th week of pregnancy. In an incomplete miscarriage, parts of the fetus or placenta (afterbirth) remain in the body.  Having a miscarriage can be an emotional experience. Talk with your health care provider about any questions you may have about miscarrying, the grieving process, and your future pregnancy plans. CAUSES   Problems with the fetal chromosomes that make it impossible for the baby to develop normally. Problems with the baby's genes or chromosomes are most often the result of errors that occur by chance as the embryo divides and grows. The problems are not inherited from the parents.  Infection of the cervix or uterus.  Hormone problems.  Problems with the cervix, such as having an incompetent cervix. This is when the tissue in the cervix is not strong enough to hold the pregnancy.  Problems with the uterus, such as an abnormally shaped uterus, uterine fibroids, or congenital abnormalities.  Certain medical conditions.  Smoking, drinking alcohol, or taking illegal drugs.  Trauma. SYMPTOMS   Vaginal bleeding or spotting, with or without cramps or pain.  Pain or cramping in the abdomen or lower back.  Passing fluid, tissue, or blood clots from the vagina. DIAGNOSIS  Your health care provider will perform a physical exam. You may also have an ultrasound to confirm the miscarriage. Blood or urine tests may also be ordered. TREATMENT   Usually, a dilation and curettage (D&C) procedure is performed. During a D&C procedure, the cervix is widened (dilated) and any remaining fetal or placental tissue is gently removed from the uterus.  Antibiotic medicines are prescribed if there is an infection. Other medicines may be given to reduce the size of the uterus (contract) if there is a lot of bleeding.  If you have Rh negative blood and your baby was Rh positive, you will need a Rho (D)  immune globulin shot. This shot will protect any future baby from having Rh blood problems in future pregnancies.  You may be confined to bed rest. This means you should stay in bed and only get up to use the bathroom. HOME CARE INSTRUCTIONS   Rest as directed by your health care provider.  Restrict activity as directed by your health care provider. You may be allowed to continue light activity if curettage was not done but you require further treatment.  Keep track of the number of pads you use each day. Keep track of how soaked (saturated) they are. Record this information.  Do not  use tampons.  Do not douche or have sexual intercourse until approved by your health care provider.  Keep all follow-up appointments for reevaluation and continuing management.  Only take over-the-counter or prescription medicines for pain, fever, or discomfort as directed by your health care provider.  Take antibiotic medicine as directed by your health care provider. Make sure you finish it even if you start to feel better. SEEK IMMEDIATE MEDICAL CARE IF:   You experience severe cramps in your stomach, back, or abdomen.  You have an unexplained temperature (make sure to record these temperatures).  You pass large clots or tissue (save these for your health care provider to inspect).  Your bleeding increases.  You become light-headed, weak, or have fainting episodes. MAKE SURE YOU:   Understand these instructions.  Will watch your condition.  Will get help right away if you are not doing well or get worse. Document Released: 07/02/2005 Document Revised: 11/16/2013 Document Reviewed:   01/29/2013 ExitCare Patient Information 2015 [employer]. This information is not intended to replace advice given to you by your health care provider. Make sure you discuss any questions you have with your health care provider.  

## 2015-02-06 NOTE — MAU Note (Addendum)
Was seen 7/2 and told had subchorionic hem. Some pink spotting for couple days. Some cramping. Went to [facility] ED earlier tonight. Was told u/s specialist not there so pt came over to [facility]

## 2015-02-09 ENCOUNTER — Inpatient Hospital Stay (HOSPITAL_COMMUNITY): Payer: Medicaid Other

## 2015-02-09 ENCOUNTER — Inpatient Hospital Stay (HOSPITAL_COMMUNITY)
Admission: AD | Admit: 2015-02-09 | Discharge: 2015-02-09 | Disposition: A | Discharge status: Home / Self Care | Payer: Medicaid Other | Source: Ambulatory Visit | Attending: Obstetrics and Gynecology | Admitting: Obstetrics and Gynecology

## 2015-02-09 ENCOUNTER — Encounter (HOSPITAL_COMMUNITY): Payer: Self-pay

## 2015-02-09 DIAGNOSIS — R102 Pelvic and perineal pain: Secondary | ICD-10-CM | POA: Diagnosis not present

## 2015-02-09 DIAGNOSIS — R109 Unspecified abdominal pain: Secondary | ICD-10-CM | POA: Diagnosis present

## 2015-02-09 DIAGNOSIS — Z3A10 10 weeks gestation of pregnancy: Secondary | ICD-10-CM | POA: Insufficient documentation

## 2015-02-09 DIAGNOSIS — O039 Complete or unspecified spontaneous abortion without complication: Secondary | ICD-10-CM | POA: Diagnosis not present

## 2015-02-09 DIAGNOSIS — F17210 Nicotine dependence, cigarettes, uncomplicated: Secondary | ICD-10-CM | POA: Diagnosis not present

## 2015-02-09 LAB — CBC
HCT: 37.7 % (ref 36.0–46.0)
HEMOGLOBIN: 12.6 g/dL (ref 12.0–15.0)
MCH: 29.6 pg (ref 26.0–34.0)
MCHC: 33.4 g/dL (ref 30.0–36.0)
MCV: 88.7 fL (ref 78.0–100.0)
PLATELETS: 213 10*3/uL (ref 150–400)
RBC: 4.25 MIL/uL (ref 3.87–5.11)
RDW: 12.9 % (ref 11.5–15.5)
WBC: 8.9 10*3/uL (ref 4.0–10.5)

## 2015-02-09 LAB — HCG, QUANTITATIVE, PREGNANCY: hCG, Beta Chain, Quant, S: 1640 m[IU]/mL — ABNORMAL HIGH (ref <5)

## 2015-02-09 MED ORDER — HYDROMORPHONE HCL 1 MG/ML IJ SOLN
1.0000 mg | Freq: Once | INTRAMUSCULAR | Status: AC
  Administered 2015-02-09: 1 mg via INTRAMUSCULAR
  Filled 2015-02-09: qty 1

## 2015-02-09 MED ORDER — KETOROLAC TROMETHAMINE 60 MG/2ML IM SOLN
60.0000 mg | Freq: Once | INTRAMUSCULAR | Status: AC
  Administered 2015-02-09: 60 mg via INTRAMUSCULAR
  Filled 2015-02-09: qty 2

## 2015-02-09 MED ORDER — OXYCODONE-ACETAMINOPHEN 5-325 MG PO TABS: 1.0000 | ORAL_TABLET | ORAL | Status: DC | PRN

## 2015-02-09 NOTE — MAU Note (Signed)
Pt reports she was told she has a miscarriage. Given cytotec took 4 days ago. Still bleeding and having pain. Out of pain medication. C/o N/Vs well.

## 2015-02-09 NOTE — Discharge Instructions (Signed)
Miscarriage A miscarriage is the sudden loss of an unborn baby (fetus) before the 20th week of pregnancy. Most miscarriages happen in the first 3 months of pregnancy. Sometimes, it happens before a woman even knows she is pregnant. A miscarriage is also called a "spontaneous miscarriage" or "early pregnancy loss." Having a miscarriage can be an emotional experience. Talk with your caregiver about any questions you may have about miscarrying, the grieving process, and your future pregnancy plans. CAUSES   Problems with the fetal chromosomes that make it impossible for the baby to develop normally. Problems with the baby's genes or chromosomes are most often the result of errors that occur, by chance, as the embryo divides and grows. The problems are not inherited from the parents.  Infection of the cervix or uterus.   Hormone problems.   Problems with the cervix, such as having an incompetent cervix. This is when the tissue in the cervix is not strong enough to hold the pregnancy.   Problems with the uterus, such as an abnormally shaped uterus, uterine fibroids, or congenital abnormalities.   Certain medical conditions.   Smoking, drinking alcohol, or taking illegal drugs.   Trauma.  Often, the cause of a miscarriage is unknown.  SYMPTOMS   Vaginal bleeding or spotting, with or without cramps or pain.  Pain or cramping in the abdomen or lower back.  Passing fluid, tissue, or blood clots from the vagina. DIAGNOSIS  Your caregiver will perform a physical exam. You may also have an ultrasound to confirm the miscarriage. Blood or urine tests may also be ordered. TREATMENT   Sometimes, treatment is not necessary if you naturally pass all the fetal tissue that was in the uterus. If some of the fetus or placenta remains in the body (incomplete miscarriage), tissue left behind may become infected and must be removed. Usually, a dilation and curettage (D and C) procedure is performed.  During a D and C procedure, the cervix is widened (dilated) and any remaining fetal or placental tissue is gently removed from the uterus.  Antibiotic medicines are prescribed if there is an infection. Other medicines may be given to reduce the size of the uterus (contract) if there is a lot of bleeding.  If you have Rh negative blood and your baby was Rh positive, you will need a Rh immunoglobulin shot. This shot will protect any future baby from having Rh blood problems in future pregnancies. HOME CARE INSTRUCTIONS   Your caregiver may order bed rest or may allow you to continue light activity. Resume activity as directed by your caregiver.  Have someone help with home and family responsibilities during this time.   Keep track of the number of sanitary pads you use each day and how soaked (saturated) they are. Write down this information.   Do not use tampons. Do not douche or have sexual intercourse until approved by your caregiver.   Only take over-the-counter or prescription medicines for pain or discomfort as directed by your caregiver.   Do not take aspirin. Aspirin can cause bleeding.   Keep all follow-up appointments with your caregiver.   If you or your partner have problems with grieving, talk to your caregiver or seek counseling to help cope with the pregnancy loss. Allow enough time to grieve before trying to get pregnant again.  SEEK IMMEDIATE MEDICAL CARE IF:   You have severe cramps or pain in your back or abdomen.  You have a fever.  You pass large blood clots (walnut-sized   or larger) ortissue from your vagina. Save any tissue for your caregiver to inspect.   Your bleeding increases.   You have a thick, bad-smelling vaginal discharge.  You become lightheaded, weak, or you faint.   You have chills.  MAKE SURE YOU:  Understand these instructions.  Will watch your condition.  Will get help right away if you are not doing well or get  worse. Document Released: 12/26/2000 Document Revised: 10/27/2012 Document Reviewed: 08/21/2011 ExitCare Patient Information 2015 [employer]. This information is not intended to replace advice given to you by your health care provider. Make sure you discuss any questions you have with your health care provider.  

## 2015-02-09 NOTE — MAU Provider Note (Signed)
History     CSN: [id number]  Arrival date and time: 02/09/15 4098   First Provider Initiated Contact with Patient 02/09/15 2021      Chief Complaint  Patient presents with  . Vaginal Bleeding   HPI Comments: Haley Greer is a 34 y.o. [id number] at [redacted]w[redacted]d who presents otday with abdominal pain and vaginal bleeding. She states that she took the cytotec as prescribed on Sunday. She had heavy bleeding and "passed a bunch of stuff in the toilet". Then the bleeding stopped. However, today she started bleeding again and has had increased pain.   Vaginal Bleeding The patient's primary symptoms include vaginal bleeding. This is a new problem. The current episode started today. The problem occurs constantly. The problem has been unchanged. The pain is severe (01/10). The problem affects both sides. She is pregnant. The vaginal discharge was bloody. The vaginal bleeding is heavier than menses. She has been passing clots. She has been passing tissue. Nothing aggravates the symptoms. She has tried nothing for the symptoms.    Past Medical History  Diagnosis Date  . Migraine     Past Surgical History  Procedure Laterality Date  . Cholecystectomy      History reviewed. No pertinent family history.  History  Substance Use Topics  . Smoking status: Current Some Day Smoker    Types: Cigarettes  . Smokeless tobacco: Never Used  . Alcohol Use: No    Allergies: No Known Allergies  Prescriptions prior to admission  Medication Sig Dispense Refill Last Dose  . HYDROcodone-acetaminophen (NORCO/VICODIN) 5-325 MG per tablet Take 1-2 tablets by mouth every 6 (six) hours as needed for moderate pain. 10 tablet 0 02/08/2015 at Unknown time  . ibuprofen (ADVIL,MOTRIN) 600 MG tablet Take 1 tablet (600 mg total) by mouth every 6 (six) hours as needed. 30 tablet 0 02/08/2015 at Unknown time  . misoprostol (CYTOTEC) 200 MCG tablet Place 4 tablets (800 mcg total) vaginally once. Insert all 4 tablets into  the vagina at once. 4 tablet 0 Past Week at Unknown time  . Prenatal Vit-Fe Fumarate-FA (PRENATAL MULTIVITAMIN) TABS tablet Take 1 tablet by mouth daily at 12 noon.   Past Week at Unknown time  . promethazine (PHENERGAN) 12.5 MG tablet Take 1 tablet (12.5 mg total) by mouth every 6 (six) hours as needed for nausea or vomiting. 20 tablet 0 02/08/2015 at Unknown time    Review of Systems  Genitourinary: Positive for vaginal bleeding.   Physical Exam   Blood pressure 123/81, pulse 80, temperature 98.7 F (37.1 C), temperature source Oral, resp. rate 18, height  (1.803 m), weight 87.816 kg (193 lb 9.6 oz).  Physical Exam  Nursing note and vitals reviewed. Constitutional: She is oriented to person, place, and time. She appears well-developed and well-nourished. No distress.  HENT:  Head: Normocephalic.  Cardiovascular: Normal rate.   Respiratory: Effort normal.  GI: Soft. There is no tenderness. There is no rebound.  Genitourinary:   External: no lesion Vagina: small amount of blood and clots seen  Cervix: pink, smooth, no CMT Uterus: NSSC Adnexa: NT   Neurological: She is alert and oriented to person, place, and time.  Skin: Skin is warm and dry.  Psychiatric: She has a normal mood and affect.    MAU Course  Procedures  MDM Patient has had toradol and dilaudid. She reports that her pain has improved.   Assessment and Plan   1. Pelvic pain in female   2. SAB (spontaneous abortion)  DC home Comfort measures reviewed  Bleeding precautions RX: percocet PRN #15 Return to MAU as needed FU with OB as planned  Follow-up Information    Follow up with [facility].   Specialty:  Obstetrics and Gynecology   Why:  As scheduled for 02/14/15    Contact information:   [address] [place] [address] [phone]        Tawnya Crook 02/09/2015, 8:25 PM

## 2015-02-10 LAB — ABO/RH: ABO/RH(D): O NEG

## 2015-02-14 ENCOUNTER — Encounter: Payer: Self-pay | Admitting: Obstetrics and Gynecology

## 2015-02-14 ENCOUNTER — Ambulatory Visit (INDEPENDENT_AMBULATORY_CARE_PROVIDER_SITE_OTHER): Payer: Medicaid Other | Admitting: Obstetrics and Gynecology

## 2015-02-14 VITALS — BP 110/68 | HR 59 | Temp 98.8°F | Ht 71.0 in | Wt 194.7 lb

## 2015-02-14 DIAGNOSIS — O039 Complete or unspecified spontaneous abortion without complication: Secondary | ICD-10-CM

## 2015-02-14 MED ORDER — NORGESTIM-ETH ESTRAD TRIPHASIC 0.18/0.215/0.25 MG-25 MCG PO TABS: 1.0000 | ORAL_TABLET | Freq: Every day | ORAL | Status: DC

## 2015-02-14 NOTE — Progress Notes (Signed)
Subjective:     Patient ID: Haley Greer, female   DOB: July 04, 1981, 34 y.o.   MRN: [id number]  HPI   34 yo [id number] here for SAB f/u.  On 7/2 had first trimester bleeding, thought to be subchorionic in nature, and received rhogam.  On 7/24 presented to MAU with vaginal bleeding. U/s showed no fetal cardiac activity. Opted for medical mgmt with cytotec and passed clots and what sound like products. Currently very mild spotting. No cramping. Feeling sad, talking with family, doesn't want grief or other counseling. Hx regular periods. Desires OCPs. Smokes, is 34 years-old. No history breast or ovarian cancer. Hx migraine but no aura. No history DVT or stroke.   PMH - denies  Review of Systems  otherwise negative    Objective:   Physical Exam  NAD Normocephalic,atraumatic Abdomen soft, not gravid, non-tender Palpable distal puses Skin normal    Assessment/Plan:     # Completed first trimester abortion # Rh negative - appropriately down-trending HCG and interval u/s showing completed ab - received rhogam earlier that same month and so am not re-administering - bleeding and infection return precautions discussed - grief counseling offered but declined  # Contraception - desires OCPs. Smokes a few cigarettes/day, age 34, hx migraine but no aura. No absolute contraindications, but explained non-estrogen methods preferable. However, patient declines and is aware of slightly elevated thrombotic risk - quick-start ortho-tri-cyclen lo, instructions for what to do with missed pills discussed, use back-up contraception for first week

## 2015-03-11 MED FILL — Rho D Immune Globulin Sol Pref Syr 1500 Unt/2ML (300MCG/2ML): INTRAMUSCULAR | Qty: 2 | Status: AC

## 2015-03-14 MED FILL — Rho D Immune Globulin Sol Pref Syr 1500 Unt/2ML (300MCG/2ML): INTRAMUSCULAR | Qty: 2 | Status: AC

## 2015-03-15 MED FILL — Rho D Immune Globulin Sol Pref Syr 1500 Unt/2ML (300MCG/2ML): INTRAMUSCULAR | Qty: 2 | Status: AC

## 2015-07-07 ENCOUNTER — Ambulatory Visit (INDEPENDENT_AMBULATORY_CARE_PROVIDER_SITE_OTHER): Payer: Medicaid Other | Admitting: *Deleted

## 2015-07-07 ENCOUNTER — Other Ambulatory Visit (HOSPITAL_COMMUNITY)
Admission: RE | Admit: 2015-07-07 | Discharge: 2015-07-07 | Disposition: A | Discharge status: Home / Self Care | Payer: Medicaid Other | Source: Ambulatory Visit | Attending: Family Medicine | Admitting: Family Medicine

## 2015-07-07 ENCOUNTER — Encounter: Payer: Self-pay | Admitting: *Deleted

## 2015-07-07 DIAGNOSIS — Z3201 Encounter for pregnancy test, result positive: Secondary | ICD-10-CM | POA: Diagnosis not present

## 2015-07-07 DIAGNOSIS — Z113 Encounter for screening for infections with a predominantly sexual mode of transmission: Secondary | ICD-10-CM | POA: Diagnosis present

## 2015-07-07 LAB — POCT PREGNANCY, URINE
PREG TEST UR: POSITIVE — AB
Preg Test, Ur: POSITIVE — AB

## 2015-07-07 NOTE — Progress Notes (Signed)
Pt here for pregnancy test. 

## 2015-07-08 LAB — GC/CHLAMYDIA PROBE AMP (~~LOC~~) NOT AT ARMC
Chlamydia: NEGATIVE
NEISSERIA GONORRHEA: NEGATIVE

## 2015-07-08 LAB — PRENATAL PROFILE (SOLSTAS)
Antibody Screen: NEGATIVE
BASOS PCT: 0 % (ref 0–1)
Basophils Absolute: 0 10*3/uL (ref 0.0–0.1)
Eosinophils Absolute: 0.1 10*3/uL (ref 0.0–0.7)
Eosinophils Relative: 2 % (ref 0–5)
HEMATOCRIT: 41.5 % (ref 36.0–46.0)
HEMOGLOBIN: 13.7 g/dL (ref 12.0–15.0)
HEP B S AG: NEGATIVE
HIV: NONREACTIVE
Lymphocytes Relative: 31 % (ref 12–46)
Lymphs Abs: 2 10*3/uL (ref 0.7–4.0)
MCH: 29 pg (ref 26.0–34.0)
MCHC: 33 g/dL (ref 30.0–36.0)
MCV: 87.7 fL (ref 78.0–100.0)
MPV: 9.8 fL (ref 8.6–12.4)
Monocytes Absolute: 0.4 10*3/uL (ref 0.1–1.0)
Monocytes Relative: 6 % (ref 3–12)
NEUTROS PCT: 61 % (ref 43–77)
Neutro Abs: 3.9 10*3/uL (ref 1.7–7.7)
Platelets: 213 10*3/uL (ref 150–400)
RBC: 4.73 MIL/uL (ref 3.87–5.11)
RDW: 13 % (ref 11.5–15.5)
RUBELLA: 1.99 {index} — AB (ref <0.90)
Rh Type: NEGATIVE
WBC: 6.4 10*3/uL (ref 4.0–10.5)

## 2015-07-09 LAB — CULTURE, OB URINE

## 2015-07-13 LAB — CANNABANOIDS (GC/LC/MS), URINE: THC-COOH (GC/LC/MS), ur confirm: 2122 ng/mL — AB (ref <5)

## 2015-07-14 LAB — PRESCRIPTION MONITORING PROFILE (19 PANEL)
AMPHETAMINE/METH: NEGATIVE ng/mL
Barbiturate Screen, Urine: NEGATIVE ng/mL
Benzodiazepine Screen, Urine: NEGATIVE ng/mL
Buprenorphine, Urine: NEGATIVE ng/mL
Carisoprodol, Urine: NEGATIVE ng/mL
Cocaine Metabolites: NEGATIVE ng/mL
Creatinine, Urine: 236.16 mg/dL (ref >20.0)
ECSTASY: NEGATIVE ng/mL
Fentanyl, Ur: NEGATIVE ng/mL
MEPERIDINE UR: NEGATIVE ng/mL
METHADONE SCREEN, URINE: NEGATIVE ng/mL
METHAQUALONE SCREEN (URINE): NEGATIVE ng/mL
NITRITES URINE, INITIAL: NEGATIVE ug/mL
Opiate Screen, Urine: NEGATIVE ng/mL
Oxycodone Screen, Ur: NEGATIVE ng/mL
PROPOXYPHENE: NEGATIVE ng/mL
Phencyclidine, Ur: NEGATIVE ng/mL
TAPENTADOLUR: NEGATIVE ng/mL
TRAMADOL UR: NEGATIVE ng/mL
Zolpidem, Urine: NEGATIVE ng/mL
pH, Initial: 5.6 pH (ref 4.5–8.9)

## 2015-07-17 NOTE — L&D Delivery Note (Signed)
Delivery Note Pt pushed once and at 10:30 PM a viable female was delivered via Vaginal, Spontaneous Delivery (Presentation: LOA ).  APGAR: 7, 8; weight 6 lb 5.1 oz (2865 g).   Placenta status: spont , intact .  Cord:  3 vessels  Anesthesia:  Epidural Episiotomy: None Lacerations: None Est. Blood Loss (mL): 50  Mom to postpartum.  Baby to Couplet care / Skin to Skin.   Cam Hai CNM 02/10/2016, 10:42 PM

## 2015-07-26 ENCOUNTER — Ambulatory Visit (INDEPENDENT_AMBULATORY_CARE_PROVIDER_SITE_OTHER): Payer: Medicaid Other | Admitting: Advanced Practice Midwife

## 2015-07-26 ENCOUNTER — Encounter: Payer: Self-pay | Admitting: Advanced Practice Midwife

## 2015-07-26 ENCOUNTER — Other Ambulatory Visit: Payer: Self-pay | Admitting: Advanced Practice Midwife

## 2015-07-26 ENCOUNTER — Ambulatory Visit (HOSPITAL_COMMUNITY)
Admission: RE | Admit: 2015-07-26 | Discharge: 2015-07-26 | Disposition: A | Discharge status: Home / Self Care | Payer: Medicaid Other | Source: Ambulatory Visit | Attending: Family Medicine | Admitting: Family Medicine

## 2015-07-26 ENCOUNTER — Encounter (HOSPITAL_COMMUNITY): Payer: Self-pay

## 2015-07-26 ENCOUNTER — Other Ambulatory Visit: Payer: Self-pay | Admitting: Family Medicine

## 2015-07-26 VITALS — BP 126/74 | HR 84 | Temp 98.3°F | Wt 189.4 lb

## 2015-07-26 DIAGNOSIS — O24911 Unspecified diabetes mellitus in pregnancy, first trimester: Secondary | ICD-10-CM | POA: Diagnosis present

## 2015-07-26 DIAGNOSIS — Z36 Encounter for antenatal screening of mother: Secondary | ICD-10-CM | POA: Diagnosis present

## 2015-07-26 DIAGNOSIS — Z3480 Encounter for supervision of other normal pregnancy, unspecified trimester: Secondary | ICD-10-CM | POA: Insufficient documentation

## 2015-07-26 DIAGNOSIS — O99321 Drug use complicating pregnancy, first trimester: Secondary | ICD-10-CM

## 2015-07-26 DIAGNOSIS — O99331 Smoking (tobacco) complicating pregnancy, first trimester: Secondary | ICD-10-CM

## 2015-07-26 DIAGNOSIS — Z3A13 13 weeks gestation of pregnancy: Secondary | ICD-10-CM | POA: Diagnosis not present

## 2015-07-26 DIAGNOSIS — O360120 Maternal care for anti-D [Rh] antibodies, second trimester, not applicable or unspecified: Secondary | ICD-10-CM

## 2015-07-26 DIAGNOSIS — Z369 Encounter for antenatal screening, unspecified: Secondary | ICD-10-CM

## 2015-07-26 DIAGNOSIS — O26892 Other specified pregnancy related conditions, second trimester: Secondary | ICD-10-CM

## 2015-07-26 DIAGNOSIS — Z6791 Unspecified blood type, Rh negative: Secondary | ICD-10-CM | POA: Insufficient documentation

## 2015-07-26 DIAGNOSIS — Z3482 Encounter for supervision of other normal pregnancy, second trimester: Secondary | ICD-10-CM

## 2015-07-26 DIAGNOSIS — Z3201 Encounter for pregnancy test, result positive: Secondary | ICD-10-CM

## 2015-07-26 LAB — POCT URINALYSIS DIP (DEVICE)
Bilirubin Urine: NEGATIVE
GLUCOSE, UA: NEGATIVE mg/dL
HGB URINE DIPSTICK: NEGATIVE
Ketones, ur: NEGATIVE mg/dL
LEUKOCYTES UA: NEGATIVE
Nitrite: NEGATIVE
Protein, ur: NEGATIVE mg/dL
Specific Gravity, Urine: >=1.03 (ref 1.005–1.030)
UROBILINOGEN UA: 0.2 mg/dL (ref 0.0–1.0)
pH: 5.5 (ref 5.0–8.0)

## 2015-07-26 NOTE — Patient Instructions (Signed)

## 2015-07-26 NOTE — Progress Notes (Signed)
Pt declines having a pap smear perform today.

## 2015-07-26 NOTE — Progress Notes (Signed)
New OB, See Smart Set  Subjective:    Haley Greer is a [id number] 838w0d being seen today for her first obstetrical visit.  Her obstetrical history is significant for SAB in July, FH diabetes. Patient does intend to breast feed. Pregnancy history fully reviewed.  Patient reports occasional cramps.  Filed Vitals:   07/26/15 1306  BP: 126/74  Pulse: 84  Temp: 98.3 F (36.8 C)  Weight: 85.911 kg (189 lb 6.4 oz)    HISTORY: OB History  Gravida Para Term Preterm AB SAB TAB Ectopic Multiple Living  6 3 3  2 2    0 3    # Outcome Date GA Lbr Len/2nd Weight Sex Delivery Anes PTL Lv  6 Current           5 SAB 01/28/15 [id number]         4 SAB 06/29/14        FD  3 Term 05/27/11    F Vag-Spont   Y  2 Term 09/30/06    Genella MechM Vag-Spont   Y  1 Term 04/06/03    F Vag-Spont   Y     Past Medical History  Diagnosis Date  . Migraine    Past Surgical History  Procedure Laterality Date  . Cholecystectomy     History reviewed. No pertinent family history.   Exam    Uterus:  Fundal Height: 13 cm  Pelvic Exam:    Perineum: Declines pelvic exam   Vulva: Declines exam   Vagina:  Declines exam   pH:    Cervix: Declines exam   Adnexa: not evaluated   Bony Pelvis: gynecoid Proven   System: Breast:  normal appearance, no masses or tenderness   Skin: normal coloration and turgor, no rashes    Neurologic: oriented   Extremities: normal strength, tone, and muscle mass   HEENT neck supple with midline trachea   Mouth/Teeth mucous membranes moist, pharynx normal without lesions   Neck supple   Cardiovascular: regular rate and rhythm   Respiratory:  appears well, vitals normal, no respiratory distress, acyanotic, normal RR, ear and throat exam is normal   Abdomen: soft, non-tender; bowel sounds normal; no masses,  no organomegaly   Urinary: Declined exam      Assessment:    Pregnancy: [id number] Patient Active Problem List   Diagnosis Date Noted  . Spontaneous abortion 02/14/2015         Plan:     Initial labs drawn. Prenatal vitamins. Problem list reviewed and updated. Genetic Screening discussed First Screen: Done today in MFM, NT normal.  Ultrasound discussed; fetal survey: requested.  Follow up in 4 weeks. 50% of 30 min visit spent on counseling and coordination of care.   Routines reviewed. How practice works reviewed. Warning signs reviewed. Reminded to come in for RhIg if bleeds.    Haley Greer,Haley Greer 07/26/2015

## 2015-07-28 LAB — GLUCOSE TOLERANCE, 1 HOUR (50G) W/O FASTING: Glucose, 1 Hour GTT: 94 mg/dL (ref 70–140)

## 2015-07-29 ENCOUNTER — Encounter: Payer: Self-pay | Admitting: *Deleted

## 2015-07-29 ENCOUNTER — Other Ambulatory Visit (HOSPITAL_COMMUNITY): Payer: Self-pay

## 2015-07-29 DIAGNOSIS — Z3482 Encounter for supervision of other normal pregnancy, second trimester: Secondary | ICD-10-CM

## 2015-08-14 ENCOUNTER — Inpatient Hospital Stay (HOSPITAL_COMMUNITY)
Admission: AD | Admit: 2015-08-14 | Discharge: 2015-08-14 | Disposition: A | Discharge status: Home / Self Care | Payer: Medicaid Other | Source: Ambulatory Visit | Attending: Obstetrics & Gynecology | Admitting: Obstetrics & Gynecology

## 2015-08-14 ENCOUNTER — Encounter (HOSPITAL_COMMUNITY): Payer: Self-pay | Admitting: *Deleted

## 2015-08-14 DIAGNOSIS — N949 Unspecified condition associated with female genital organs and menstrual cycle: Secondary | ICD-10-CM

## 2015-08-14 DIAGNOSIS — O99512 Diseases of the respiratory system complicating pregnancy, second trimester: Secondary | ICD-10-CM | POA: Diagnosis not present

## 2015-08-14 DIAGNOSIS — R1032 Left lower quadrant pain: Secondary | ICD-10-CM | POA: Diagnosis present

## 2015-08-14 DIAGNOSIS — O26892 Other specified pregnancy related conditions, second trimester: Secondary | ICD-10-CM | POA: Diagnosis not present

## 2015-08-14 DIAGNOSIS — O9989 Other specified diseases and conditions complicating pregnancy, childbirth and the puerperium: Secondary | ICD-10-CM

## 2015-08-14 DIAGNOSIS — J069 Acute upper respiratory infection, unspecified: Secondary | ICD-10-CM | POA: Diagnosis not present

## 2015-08-14 DIAGNOSIS — Z3A15 15 weeks gestation of pregnancy: Secondary | ICD-10-CM | POA: Insufficient documentation

## 2015-08-14 DIAGNOSIS — M549 Dorsalgia, unspecified: Secondary | ICD-10-CM

## 2015-08-14 LAB — URINE MICROSCOPIC-ADD ON

## 2015-08-14 LAB — URINALYSIS, ROUTINE W REFLEX MICROSCOPIC
Bilirubin Urine: NEGATIVE
Glucose, UA: NEGATIVE mg/dL
Ketones, ur: NEGATIVE mg/dL
Leukocytes, UA: NEGATIVE
NITRITE: NEGATIVE
Protein, ur: NEGATIVE mg/dL
pH: 6 (ref 5.0–8.0)

## 2015-08-14 MED ORDER — ACETAMINOPHEN 500 MG PO TABS
1000.0000 mg | ORAL_TABLET | Freq: Once | ORAL | Status: AC
  Administered 2015-08-14: 1000 mg via ORAL
  Filled 2015-08-14: qty 2
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
## 2015-08-14 NOTE — Discharge Instructions (Signed)
Back Pain in Pregnancy °Back pain during pregnancy is common. It happens in about half of all pregnancies. It is important for you and your baby that you remain active during your pregnancy. If you feel that back pain is not allowing you to remain active or sleep well, it is time to see your caregiver. Back pain may be caused by several factors related to changes during your pregnancy. Fortunately, unless you had trouble with your back before your pregnancy, the pain is likely to get better after you deliver. °Low back pain usually occurs between the fifth and seventh months of pregnancy. It can, however, happen in the first couple months. Factors that increase the risk of back problems include:  °· Previous back problems. °· Injury to your back. °· Having twins or multiple births. °· A chronic cough. °· Stress. °· Job-related repetitive motions. °· Muscle or spinal disease in the back. °· Family history of back problems, ruptured (herniated) discs, or osteoporosis. °· Depression, anxiety, and panic attacks. °CAUSES  °· When you are pregnant, your body produces a hormone called relaxin. This hormone makes the ligaments connecting the low back and pubic bones more flexible. This flexibility allows the baby to be delivered more easily. When your ligaments are loose, your muscles need to work harder to support your back. Soreness in your back can come from tired muscles. Soreness can also come from back tissues that are irritated since they are receiving less support. °· As the baby grows, it puts pressure on the nerves and blood vessels in your pelvis. This can cause back pain. °· As the baby grows and gets heavier during pregnancy, the uterus pushes the stomach muscles forward and changes your center of gravity. This makes your back muscles work harder to maintain good posture. °SYMPTOMS  °Lumbar pain during pregnancy °Lumbar pain during pregnancy usually occurs at or above the waist in the center of the back. There  may be pain and numbness that radiates into your leg or foot. This is similar to low back pain experienced by non-pregnant women. It usually increases with sitting for long periods of time, standing, or repetitive lifting. Tenderness may also be present in the muscles along your upper back. °Posterior pelvic pain during pregnancy °Pain in the back of the pelvis is more common than lumbar pain in pregnancy. It is a deep pain felt in your side at the waistline, or across the tailbone (sacrum), or in both places. You may have pain on one or both sides. This pain can also go into the buttocks and backs of the upper thighs. Pubic and groin pain may also be present. The pain does not quickly resolve with rest, and morning stiffness may also be present. °Pelvic pain during pregnancy can be brought on by most activities. A high level of fitness before and during pregnancy may or may not prevent this problem. Labor pain is usually 1 to 2 minutes apart, lasts for about 1 minute, and involves a bearing down feeling or pressure in your pelvis. However, if you are at term with the pregnancy, constant low back pain can be the beginning of early labor, and you should be aware of this. °DIAGNOSIS  °X-rays of the back should not be done during the first 12 to 14 weeks of the pregnancy and only when absolutely necessary during the rest of the pregnancy. MRIs do not give off radiation and are safe during pregnancy. MRIs also should only be done when absolutely necessary. °HOME CARE INSTRUCTIONS °· Exercise   as directed by your caregiver. Exercise is the most effective way to prevent or manage back pain. If you have a back problem, it is especially important to avoid sports that require sudden body movements. Swimming and walking are great activities. °· Do not stand in one place for long periods of time. °· Do not wear high heels. °· Sit in chairs with good posture. Use a pillow on your lower back if necessary. Make sure your head  rests over your shoulders and is not hanging forward. °· Try sleeping on your side, preferably the left side, with a pillow or two between your legs. If you are sore after a night's rest, your bed may be too soft. Try placing a board between your mattress and box spring. °· Listen to your body when lifting. If you are experiencing pain, ask for help or try bending your knees more so you can use your leg muscles rather than your back muscles. Squat down when picking up something from the floor. Do not bend over. °· Eat a healthy diet. Try to gain weight within your caregiver's recommendations. °· Use heat or cold packs 3 to 4 times a day for 15 minutes to help with the pain. °· Only take over-the-counter or prescription medicines for pain, discomfort, or fever as directed by your caregiver. °Sudden (acute) back pain °· Use bed rest for only the most extreme, acute episodes of back pain. Prolonged bed rest over 48 hours will aggravate your condition. °· Ice is very effective for acute conditions. °¨ Put ice in a plastic bag. °¨ Place a towel between your skin and the bag. °¨ Leave the ice on for 10 to 20 minutes every 2 hours, or as needed. °· Using heat packs for 30 minutes prior to activities is also helpful. °Continued back pain °See your caregiver if you have continued problems. Your caregiver can help or refer you for appropriate physical therapy. With conditioning, most back problems can be avoided. Sometimes, a more serious issue may be the cause of back pain. You should be seen right away if new problems seem to be developing. Your caregiver may recommend: °· A maternity girdle. °· An elastic sling. °· A back brace. °· A massage therapist or acupuncture. °SEEK MEDICAL CARE IF:  °· You are not able to do most of your daily activities, even when taking the pain medicine you were given. °· You need a referral to a physical therapist or chiropractor. °· You want to try acupuncture. °SEEK IMMEDIATE MEDICAL CARE  IF: °· You develop numbness, tingling, weakness, or problems with the use of your arms or legs. °· You develop severe back pain that is no longer relieved with medicines. °· You have a sudden change in bowel or bladder control. °· You have increasing pain in other areas of the body. °· You develop shortness of breath, dizziness, or fainting. °· You develop nausea, vomiting, or sweating. °· You have back pain which is similar to labor pains. °· You have back pain along with your water breaking or vaginal bleeding. °· You have back pain or numbness that travels down your leg. °· Your back pain developed after you fell. °· You develop pain on one side of your back. You may have a kidney stone. °· You see blood in your urine. You may have a bladder infection or kidney stone. °· You have back pain with blisters. You may have shingles. °Back pain is fairly common during pregnancy but should not be accepted as just part of   the process. Back pain should always be treated as soon as possible. This will make your pregnancy as pleasant as possible.   This information is not intended to replace advice given to you by your health care provider. Make sure you discuss any questions you have with your health care provider.   Document Released: 10/10/2005 Document Revised: 09/24/2011 Document Reviewed: 11/21/2010 Elsevier Interactive Patient Education 2016 Else[employer] Inc.  Round Ligament Pain During Pregnancy   Round ligament pain is a sharp pain or jabbing feeling often felt in the lower belly or groin area on one or both sides. It is one of the most common complaints during pregnancy and is considered a normal part of pregnancy. It is most often felt during the second trimester.   Here is what you need to know about round ligament pain, including some tips to help you feel better.   Causes of Round Ligament Pain:    Several thick ligaments surround and support your womb (uterus) as it grows during pregnancy. One of  them is called the round ligament.   The round ligament connects the front part of the womb to your groin, the area where your legs attach to your pelvis. The round ligament normally tightens and relaxes slowly.   As your baby and womb grow, the round ligament stretches. That makes it more likely to become strained.   Sudden movements can cause the ligament to tighten quickly, like a rubber band snapping. This causes a sudden and quick jabbing feeling.   Symptoms of Round Ligament Pain   Round ligament pain can be concerning and uncomfortable. But it is considered normal as your body changes during pregnancy.   The symptoms of round ligament pain include a sharp, sudden spasm in the belly. It usually affects the right side, but it may happen on both sides. The pain only lasts a few seconds.   Exercise may cause the pain, as will rapid movements such as:   sneezing  coughing  laughing  rolling over in bed  standing up too quickly   Treatment of Round Ligament Pain   Here are some tips that may help reduce your discomfort:   Pain relief. Take over-the-counter acetaminophen for pain, if necessary. Ask your doctor if this is OK.   Exercise. Get plenty of exercise to keep your stomach (core) muscles strong. Doing stretching exercises or prenatal yoga can be helpful. Ask your doctor which exercises are safe for you and your baby.   A helpful exercise involves putting your hands and knees on the floor, lowering your head, and pushing your backside into the air.   Avoid sudden movements. Change positions slowly (such as standing up or sitting down) to avoid sudden movements that may cause stretching and pain.   Flex your hips. Bend and flex your hips before you cough, sneeze, or laugh to avoid pulling on the ligaments.   Apply warmth. A heating pad or warm bath may be helpful. Ask your doctor if this is OK. Extreme heat can be dangerous to the baby.   You should try to modify your daily  activity level and avoid positions that may worsen the condition.   When to Call the Doctor/Midwife   Always tell your doctor or midwife about any type of pain you have during pregnancy. Round ligament pain is quick and doesn't last long.   Call your health care provider immediately if you have:   severe pain  fever  chills  pain on urination  difficulty walking  ° °Belly pain during pregnancy can be due to many different causes. It is important for your doctor to rule out more serious conditions, including pregnancy complications such as placenta abruption or non-pregnancy illnesses such as:  ° °inguinal hernia  °appendicitis  °stomach, liver, and kidney problems  °Preterm labor pains may sometimes be mistaken for round ligament pain.  °    °   ° ° ° ° °

## 2015-08-14 NOTE — MAU Note (Signed)
Pt presents to MAU with complaints of pain shooting through her spine and pain in her left lower abdomen. Denies any vaginal bleeding or abnormal discharge
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
## 2015-08-23 ENCOUNTER — Ambulatory Visit (INDEPENDENT_AMBULATORY_CARE_PROVIDER_SITE_OTHER): Payer: Medicaid Other | Admitting: Advanced Practice Midwife

## 2015-08-23 VITALS — BP 114/69 | HR 75 | Temp 98.0°F | Wt 193.0 lb

## 2015-08-23 DIAGNOSIS — Z3482 Encounter for supervision of other normal pregnancy, second trimester: Secondary | ICD-10-CM

## 2015-08-23 DIAGNOSIS — O360120 Maternal care for anti-D [Rh] antibodies, second trimester, not applicable or unspecified: Secondary | ICD-10-CM

## 2015-08-23 DIAGNOSIS — K0889 Other specified disorders of teeth and supporting structures: Secondary | ICD-10-CM

## 2015-08-23 DIAGNOSIS — O360121 Maternal care for anti-D [Rh] antibodies, second trimester, fetus 1: Secondary | ICD-10-CM

## 2015-08-23 LAB — POCT URINALYSIS DIP (DEVICE)
Bilirubin Urine: NEGATIVE
GLUCOSE, UA: NEGATIVE mg/dL
Hgb urine dipstick: NEGATIVE
Ketones, ur: NEGATIVE mg/dL
LEUKOCYTES UA: NEGATIVE
NITRITE: NEGATIVE
PROTEIN: NEGATIVE mg/dL
SPECIFIC GRAVITY, URINE: 1.015 (ref 1.005–1.030)
UROBILINOGEN UA: 0.2 mg/dL (ref 0.0–1.0)
pH: 6.5 (ref 5.0–8.0)

## 2015-08-23 NOTE — Patient Instructions (Addendum)
Dental Assistance:  If unable to pay or uninsured, contact: [facility]. to become qualified for the adult dental clinic. Patient must be enrolled in [facility] (uninsured, 0-200% FPL, qualifying info).  Enroll in [facility] first, then see Primary Care Physician assigned to you, the PCP makes a dental referral. [facility] will receive referral and contacts patient for appointment.  Patients with Medicaid           [address], [phone]  [facility] (Children up to 20 + Pregnant Women) - [phone]  [facility] - [address] - Suite [phone]  If unable to pay, or uninsured: contact [facility] Department [phone] in [place] - (Children only + Pregnant Women), [phone] in [facility]- Children only) to become qualified for the adult dental clinic  Must see if eligible to enroll in [facility] Marketplace before enrolling into the [facility] (exemption required) [phone] for an appointment)  [email or web address];   [phone].  If not eligible for ACA, then go by Department of Health and Human Services to see if eligible for "orange card."  [address] [place] and [address]  Once you get an orange card, you will have a Primary Care home who will then refer you to dental if needed.        Other IT consultant:   [facility] [phone] (ext [phone])   [address]  Dr. Lawrence Marseilles - [phone]   [address]    [place] - [phone]   [facility]           [address] [place], [place], [place], [address]           [phone], Ext. [phone]           2nd and 4th Thursday of the month at 6:30am (Simple extractions only - no wisdom teeth or surgery) First come/First serve -First 10 clients served           [facility] [place], [place] and [place] residents only)          [address]  [place], [place], [address]           [phone]                    [facility]           [phone]          [facility] Health Department          [phone]         [facility] Health Department - Children's Dental Clinic          [phone]   AREA PEDIATRIC/FAMILY PRACTICE PHYSICIANS  [facility] [address] [place], [place] [address] Phone - [phone]   Fax - [phone]  JACK AMOS [address] [place], [place]  [address] Phone - [phone]   Fax - [phone]  [facility] [address] [place], [place]  [address] Phone - [phone]   Fax - [phone]  [facility] [address] [place], [place]  [address] Phone - [phone]   Fax - [phone]  [facility] [address] [place], [place]  [address] Phone - [phone]   Fax -  [phone]  [facility] [address] [place], [place]  [address] Phone - [phone]   Fax - [phone]  [facility] [place] [address] [place], [place]  [address] Phone - [phone]   Fax - [phone]  [facility] AT [facility] [address] [place], [address] [place], [place]  [address] Phone - [phone]   Fax - [phone]  [facility] AT [facility] [address] [place], [place]  [address] Phone - [phone]   Fax - [phone] [facility] AT [place] [address] [place], [place]  [address] Phone - [phone]   Fax - [phone]  [facility] [address] [place], [place]  [address] Phone - [phone]   Fax - [phone]  [facility] AT [facility] [address], Suite [place], [place]  [address] Phone - [phone]   Fax - [phone]  [facility] [place] [address], [address] [place], [place]  [address] Phone -  [phone]   Fax - [phone]  [facility] [address] Suite [place], [place]  [address] Phone - [phone]  [facility] [address] [place], [place]  [address] Phone - [phone]   Fax - [phone]  [facility] [address] [place], [place]  [address] Phone - [phone]   Fax - [phone]  [facility] [address] [place], [place]  [address] Phone - [phone]   Fax - [phone]  [facility] [address] [place], [place]  [address] Phone - [phone]   Fax - [phone]   [facility] [address] [place], [place]  [address] Phone - [phone]   Fax - [phone]  [facility] [address] [place], [place]  [address] Phone - [phone]   Fax - [phone]  DAVID RUBIN [address] [place], [place]  [address] Phone - [phone]   Fax - [phone]  [facility] [address] [place], [place]  [address] Phone - [phone]   Fax - [phone]  [place] [facility] Alita Chyle [address] [place], [place]  [address] Phone - [phone]   Fax - [phone] Gerarda Fraction [address] [place], [place]  [address] Phone - [phone]   Fax - [phone]  [facility] [address] [place], [place]  [address] Phone - [phone]   Fax - [phone]  [facility] - [place] [address] [place], [place]  [address] Phone - [phone]   Fax - [phone]

## 2015-08-23 NOTE — Progress Notes (Signed)
Subjective:  Haley Greer is a 35 y.o. [phone] at [redacted]w[redacted]d being seen today for ongoing prenatal care.  She is currently monitored for the following issues for this low-risk pregnancy and has Supervision of normal subsequent pregnancy; Rh negative status during pregnancy in second trimester, antepartum; and Toothache on her problem list.  Patient reports fatigue and tooth decay with swelling.  Contractions: Not present. Vag. Bleeding: None.   . Denies leaking of fluid.   The following portions of the patient's history were reviewed and updated as appropriate: allergies, current medications, past family history, past medical history, past social history, past surgical history and problem list. Problem list updated.  Objective:   Filed Vitals:   08/23/15 1233  BP: 114/69  Pulse: 75  Temp: 98 F (36.7 C)  Weight: 193 lb (87.544 kg)    Fetal Status: Fetal Heart Rate (bpm): 147         General:  Alert, oriented and cooperative. Patient is in no acute distress.  Skin: Skin is warm and dry. No rash noted.   Cardiovascular: Normal heart rate noted  Respiratory: Normal respiratory effort, no problems with respiration noted  Abdomen: Soft, gravid, appropriate for gestational age. Pain/Pressure: Present     Pelvic: Vag. Bleeding: None     Cervical exam deferred        Extremities: Normal range of motion.  Edema: None  Mental Status: Normal mood and affect. Normal behavior. Normal judgment and thought content.   Urinalysis: Urine Protein: Negative Urine Glucose: Negative  Assessment and Plan:  Pregnancy: [id number] at [redacted]w[redacted]d  1. Rh negative status during pregnancy in second trimester, antepartum, fetus 1  - [place] MFM OB COMP + 14 WK; Future  2. Encounter for supervision of other normal pregnancy in second trimester  - [place] MFM OB COMP + 14 WK; Future  3. Toothache --Dental caries noted in right upper molar with mild edema without erythema of upper gums surrounding tooth.  No evidence of  infection with only mild tenderness to palpation, no effect on pt eating/drinking, no fever/chills today.  Pt given contact information for dental providers.   Preterm labor symptoms and general obstetric precautions including but not limited to vaginal bleeding, contractions, leaking of fluid and fetal movement were reviewed in detail with the patient. Please refer to After Visit Summary for other counseling recommendations.  Return in about 4 weeks (around 09/20/2015).   Haley Greer, CNM

## 2015-08-23 NOTE — Progress Notes (Signed)
Patient reports occasional lower abdominal cramping

## 2015-08-29 ENCOUNTER — Telehealth: Payer: Self-pay | Admitting: *Deleted

## 2015-08-29 NOTE — Telephone Encounter (Signed)
Patient calling for test results. °

## 2015-08-30 NOTE — Telephone Encounter (Signed)
Called pt and left message on her personal voice mail. I stated that eh only recent test I could find was her urinalysis on 2/7 which was normal.  Please call back and let us know what result she is looking for.

## 2015-09-06 ENCOUNTER — Ambulatory Visit (HOSPITAL_COMMUNITY)
Admission: RE | Admit: 2015-09-06 | Discharge: 2015-09-06 | Disposition: A | Discharge status: Home / Self Care | Payer: Medicaid Other | Source: Ambulatory Visit | Attending: Advanced Practice Midwife | Admitting: Advanced Practice Midwife

## 2015-09-06 DIAGNOSIS — Z36 Encounter for antenatal screening of mother: Secondary | ICD-10-CM | POA: Diagnosis not present

## 2015-09-06 DIAGNOSIS — O99332 Smoking (tobacco) complicating pregnancy, second trimester: Secondary | ICD-10-CM | POA: Diagnosis not present

## 2015-09-06 DIAGNOSIS — O99322 Drug use complicating pregnancy, second trimester: Secondary | ICD-10-CM | POA: Insufficient documentation

## 2015-09-06 DIAGNOSIS — Z3A19 19 weeks gestation of pregnancy: Secondary | ICD-10-CM | POA: Diagnosis not present

## 2015-09-06 DIAGNOSIS — Z3482 Encounter for supervision of other normal pregnancy, second trimester: Secondary | ICD-10-CM

## 2015-09-06 DIAGNOSIS — O360121 Maternal care for anti-D [Rh] antibodies, second trimester, fetus 1: Secondary | ICD-10-CM

## 2015-09-10 ENCOUNTER — Encounter (HOSPITAL_BASED_OUTPATIENT_CLINIC_OR_DEPARTMENT_OTHER): Payer: Self-pay | Admitting: *Deleted

## 2015-09-10 ENCOUNTER — Emergency Department (HOSPITAL_BASED_OUTPATIENT_CLINIC_OR_DEPARTMENT_OTHER)
Admission: EM | Admit: 2015-09-10 | Discharge: 2015-09-10 | Disposition: A | Discharge status: Home / Self Care | Payer: Medicaid Other | Attending: Emergency Medicine | Admitting: Emergency Medicine

## 2015-09-10 DIAGNOSIS — Z8679 Personal history of other diseases of the circulatory system: Secondary | ICD-10-CM | POA: Insufficient documentation

## 2015-09-10 DIAGNOSIS — J069 Acute upper respiratory infection, unspecified: Secondary | ICD-10-CM | POA: Insufficient documentation

## 2015-09-10 DIAGNOSIS — F17210 Nicotine dependence, cigarettes, uncomplicated: Secondary | ICD-10-CM | POA: Insufficient documentation

## 2015-09-10 DIAGNOSIS — R05 Cough: Secondary | ICD-10-CM | POA: Diagnosis present

## 2015-09-10 DIAGNOSIS — Z79899 Other long term (current) drug therapy: Secondary | ICD-10-CM | POA: Insufficient documentation

## 2015-09-10 LAB — URINALYSIS, ROUTINE W REFLEX MICROSCOPIC
Bilirubin Urine: NEGATIVE
Glucose, UA: NEGATIVE mg/dL
HGB URINE DIPSTICK: NEGATIVE
Ketones, ur: NEGATIVE mg/dL
Leukocytes, UA: NEGATIVE
Nitrite: NEGATIVE
PH: 6.5 (ref 5.0–8.0)
Protein, ur: NEGATIVE mg/dL
SPECIFIC GRAVITY, URINE: 1.007 (ref 1.005–1.030)

## 2015-09-10 MED ORDER — SODIUM CHLORIDE 0.9 % IV BOLUS (SEPSIS)
1000.0000 mL | Freq: Once | INTRAVENOUS | Status: AC
  Administered 2015-09-10: 1000 mL via INTRAVENOUS

## 2015-09-10 MED ORDER — ONDANSETRON HCL 4 MG/2ML IJ SOLN
4.0000 mg | Freq: Once | INTRAMUSCULAR | Status: AC
  Administered 2015-09-10: 4 mg via INTRAVENOUS
  Filled 2015-09-10: qty 2

## 2015-09-10 NOTE — Discharge Instructions (Signed)
Upper Respiratory Infection, Adult Most upper respiratory infections (URIs) are a viral infection of the air passages leading to the lungs. A URI affects the nose, throat, and upper air passages. The most common type of URI is nasopharyngitis and is typically referred to as "the common cold." URIs run their course and usually go away on their own. Most of the time, a URI does not require medical attention, but sometimes a bacterial infection in the upper airways can follow a viral infection. This is called a secondary infection. Sinus and middle ear infections are common types of secondary upper respiratory infections. Bacterial pneumonia can also complicate a URI. A URI can worsen asthma and chronic obstructive pulmonary disease (COPD). Sometimes, these complications can require emergency medical care and may be life threatening.  CAUSES Almost all URIs are caused by viruses. A virus is a type of germ and can spread from one person to another.  RISKS FACTORS You may be at risk for a URI if:   You smoke.   You have chronic heart or lung disease.  You have a weakened defense (immune) system.   You are very young or very old.   You have nasal allergies or asthma.  You work in crowded or poorly ventilated areas.  You work in health care facilities or schools. SIGNS AND SYMPTOMS  Symptoms typically develop 2-3 days after you come in contact with a cold virus. Most viral URIs last 7-10 days. However, viral URIs from the influenza virus (flu virus) can last 14-18 days and are typically more severe. Symptoms may include:   Runny or stuffy (congested) nose.   Sneezing.   Cough.   Sore throat.   Headache.   Fatigue.   Fever.   Loss of appetite.   Pain in your forehead, behind your eyes, and over your cheekbones (sinus pain).  Muscle aches.  DIAGNOSIS  Your health care provider may diagnose a URI by:  Physical exam.  Tests to check that your symptoms are not due to  another condition such as:  Strep throat.  Sinusitis.  Pneumonia.  Asthma. TREATMENT  A URI goes away on its own with time. It cannot be cured with medicines, but medicines may be prescribed or recommended to relieve symptoms. Medicines may help:  Reduce your fever.  Reduce your cough.  Relieve nasal congestion. HOME CARE INSTRUCTIONS   Take medicines only as directed by your health care provider.   Gargle warm saltwater or take cough drops to comfort your throat as directed by your health care provider.  Use a warm mist humidifier or inhale steam from a shower to increase air moisture. This may make it easier to breathe.  Drink enough fluid to keep your urine clear or pale yellow.   Eat soups and other clear broths and maintain good nutrition.   Rest as needed.   Return to work when your temperature has returned to normal or as your health care provider advises. You may need to stay home longer to avoid infecting others. You can also use a face mask and careful hand washing to prevent spread of the virus.  Increase the usage of your inhaler if you have asthma.   Do not use any tobacco products, including cigarettes, chewing tobacco, or electronic cigarettes. If you need help quitting, ask your health care provider. PREVENTION  The best way to protect yourself from getting a cold is to practice good hygiene.   Avoid oral or hand contact with people with cold   symptoms.   Wash your hands often if contact occurs.  There is no clear evidence that vitamin C, vitamin E, echinacea, or exercise reduces the chance of developing a cold. However, it is always recommended to get plenty of rest, exercise, and practice good nutrition.  SEEK MEDICAL CARE IF:   You are getting worse rather than better.   Your symptoms are not controlled by medicine.   You have chills.  You have worsening shortness of breath.  You have brown or red mucus.  You have yellow or brown nasal  discharge.  You have pain in your face, especially when you bend forward.  You have a fever.  You have swollen neck glands.  You have pain while swallowing.  You have white areas in the back of your throat. SEEK IMMEDIATE MEDICAL CARE IF:   You have severe or persistent:  Headache.  Ear pain.  Sinus pain.  Chest pain.  You have chronic lung disease and any of the following:  Wheezing.  Prolonged cough.  Coughing up blood.  A change in your usual mucus.  You have a stiff neck.  You have changes in your:  Vision.  Hearing.  Thinking.  Mood. MAKE SURE YOU:   Understand these instructions.  Will watch your condition.  Will get help right away if you are not doing well or get worse.   This information is not intended to replace advice given to you by your health care provider. Make sure you discuss any questions you have with your health care provider.   Document Released: 12/26/2000 Document Revised: 11/16/2014 Document Reviewed: 10/07/2013 Elsevier Interactive Patient Education 2016 Elsevier Inc.  

## 2015-09-10 NOTE — ED Notes (Signed)
Cough, body aches, nausea x 3 days

## 2015-09-10 NOTE — ED Provider Notes (Signed)
CSN: [id number]     Arrival date & time 09/10/15  1008 History   First MD Initiated Contact with Patient 09/10/15 1013     Chief Complaint  Patient presents with  . Cough     (Consider location/radiation/quality/duration/timing/severity/associated sxs/prior Treatment) HPI Comments: Patient presents with cough and body aches. She reports a three-day history of runny nose, postnasal drip and cough. She states her cough is nonproductive. She denies any chest pain. She also feels achy and has had associated vomiting and diarrhea. She denies abdominal pain. She denies any known fevers. She denies any sore throat other than she feels like it's a little bit sore from coughing. She currently is [redacted] weeks pregnant. She's followed by the women's outpatient clinic. She denies any urinary symptoms. She denies any vaginal bleeding or discharge. She is just starting to feel the baby move around.  Patient is a 35 y.o. female presenting with cough.  Cough Associated symptoms: rhinorrhea   Associated symptoms: no chest pain, no chills, no diaphoresis, no fever, no headaches, no rash and no shortness of breath     Past Medical History  Diagnosis Date  . Migraine    Past Surgical History  Procedure Laterality Date  . Cholecystectomy     Family History  Problem Relation Age of Onset  . Diabetes Mother   . Heart disease Father    Social History  Substance Use Topics  . Smoking status: Current Some Day Smoker -- 0.50 packs/day    Types: Cigarettes  . Smokeless tobacco: Never Used  . 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 Negative for fever, chills and diaphoresis.  HENT: Positive for congestion, postnasal drip and rhinorrhea. Negative for sneezing.   Eyes: Negative.   Respiratory: Positive for cough. Negative for chest tightness and  shortness of breath.   Cardiovascular: Negative for chest pain and leg swelling.  Gastrointestinal: Negative for nausea, vomiting, abdominal pain, diarrhea and blood in stool.  Genitourinary: Negative for frequency, hematuria, flank pain, vaginal bleeding, vaginal discharge and difficulty urinating.  Musculoskeletal: Negative for back pain and arthralgias.  Skin: Negative for rash.  Neurological: Negative for dizziness, speech difficulty, weakness, numbness and headaches.      Allergies  Review of patient's allergies indicates no known allergies.  Home Medications   Prior to Admission medications   Medication Sig Start Date End Date Taking? Authorizing Provider  Prenatal Vit-Fe Fumarate-FA (PRENATAL MULTIVITAMIN) TABS tablet Take 1 tablet by mouth daily at 12 noon. Reported on 07/26/2015    Historical Provider, MD   BP 136/79 mmHg  Pulse 98  Temp(Src) 98.5 F (36.9 C) (Oral)  Resp 18  SpO2 100%  LMP 04/26/2015 Physical Exam  Constitutional: She is oriented to person, place, and time. She appears well-developed and well-nourished.  HENT:  Head: Normocephalic and atraumatic.  Mouth/Throat: Oropharynx is clear and moist.  Eyes: Pupils are equal, round, and reactive to light.  Neck: Normal range of motion. Neck supple.  Cardiovascular: Normal rate, regular rhythm and normal heart sounds.   Pulmonary/Chest: Effort normal and breath sounds normal. No respiratory distress. She has no wheezes. She has no rales. She exhibits no tenderness.  Abdominal: Soft. Bowel sounds are normal. There is no tenderness. There is  no rebound and no guarding.  Musculoskeletal: Normal range of motion. She exhibits no edema.  Lymphadenopathy:    She has no cervical adenopathy.  Neurological: She is alert and oriented to person, place, and time.  Skin: Skin is warm and dry. No rash noted.  Psychiatric: She has a normal mood and affect.    ED Course  Procedures (including critical care time) Labs  Review Labs Reviewed  URINALYSIS, ROUTINE W REFLEX MICROSCOPIC (NOT AT [facility])    Imaging Review No results found. I have personally reviewed and evaluated these images and lab results as part of my medical decision-making.   EKG Interpretation None      MDM   Final diagnoses:  URI (upper respiratory infection)    Patient presents with URI symptoms. There is no clinical evidence of pneumonia and given the patient is pregnant, I did not feel that an x-ray is warranted at this point. She's afebrile. Her lungs are clear. She has no increased work of breathing or hypoxia. She is out of the window for possible Tamiflu. She has good fetal heart tones in the 140s. She was given a liter of IV fluids. She was discharged home in good condition. She was encouraged to follow-up with the women's outpatient clinic if her symptoms are not improving by Monday or Tuesday or return here as needed for any worsening symptoms.    Rolan Bucco, MD 09/10/15 [phone]

## 2015-09-19 ENCOUNTER — Inpatient Hospital Stay (HOSPITAL_COMMUNITY)
Admission: AD | Admit: 2015-09-19 | Discharge: 2015-09-19 | Disposition: A | Discharge status: Home / Self Care | Payer: Medicaid Other | Source: Ambulatory Visit | Attending: Family Medicine | Admitting: Family Medicine

## 2015-09-19 ENCOUNTER — Encounter (HOSPITAL_COMMUNITY): Payer: Self-pay | Admitting: *Deleted

## 2015-09-19 DIAGNOSIS — Z3A20 20 weeks gestation of pregnancy: Secondary | ICD-10-CM | POA: Diagnosis not present

## 2015-09-19 DIAGNOSIS — F17210 Nicotine dependence, cigarettes, uncomplicated: Secondary | ICD-10-CM | POA: Diagnosis not present

## 2015-09-19 DIAGNOSIS — O99332 Smoking (tobacco) complicating pregnancy, second trimester: Secondary | ICD-10-CM | POA: Diagnosis not present

## 2015-09-19 DIAGNOSIS — O26852 Spotting complicating pregnancy, second trimester: Secondary | ICD-10-CM | POA: Diagnosis present

## 2015-09-19 DIAGNOSIS — N898 Other specified noninflammatory disorders of vagina: Secondary | ICD-10-CM | POA: Diagnosis not present

## 2015-09-19 DIAGNOSIS — N939 Abnormal uterine and vaginal bleeding, unspecified: Secondary | ICD-10-CM

## 2015-09-19 NOTE — MAU Note (Signed)
Pt presents to MAU with complaints of vaginal spotting since last night. Last intercourse 2 nights ago. Reports no fetal movement. States she usually feels movement at 20 weeks with her other babies.

## 2015-09-19 NOTE — MAU Provider Note (Signed)
  History     CSN: [id number]  Arrival date and time: 09/19/15 1255   First Provider Initiated Contact with Patient 09/19/15 1321      No chief complaint on file.  HPI  Haley Greer 35 y.o. [id number] @ 5745w6d presents stating that she had some spotting after having sex 2 nights ago but has not had any vaginal spotting today. She describes it as a light pink brown. She wanted to be reassured the baby was ok.  Past Medical History  Diagnosis Date  . Migraine     Past Surgical History  Procedure Laterality Date  . Cholecystectomy      Family History  Problem Relation Age of Onset  . Diabetes Mother   . Heart disease Father     Social History  Substance Use Topics  . Smoking status: Current Some Day Smoker -- 0.50 packs/day    Types: Cigarettes  . Smokeless tobacco: Never Used  . 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 Prenatal Vit-Fe Fumarate-FA (PRENATAL MULTIVITAMIN) TABS tablet Take 1 tablet by mouth daily at 12 noon. Reported on 07/26/2015   Taking    Review of Systems  Genitourinary:       Vaginal spotting  All other systems reviewed and are negative.  Physical Exam   Blood pressure 115/67, pulse 86, temperature 97.9 F (36.6 C), resp. rate 18, last menstrual period 04/26/2015, unknown if currently breastfeeding.  Physical Exam  Nursing note and vitals reviewed. Constitutional: She is oriented to person, place, and time. She appears well-developed and well-nourished. No distress.  HENT:  Head: Normocephalic and atraumatic.  Cardiovascular: Normal rate.   Respiratory: Effort normal and breath sounds normal. No respiratory distress.  GI: Soft. There is no tenderness.  Musculoskeletal: Normal range of motion.  Neurological: She is alert and oriented to person, place, and time.  Skin: Skin is warm and dry.  Psychiatric: She has a normal  mood and affect. Her behavior is normal. Judgment and thought content normal.    MAU Course  Procedures  MDM Positive fht's; Pt has clinic appointment in 2 days and has been instructed to keep that appointment.  Assessment and Plan  Vaginal spotting  Discharge   Oris Calmes Grissett 09/19/2015, 1:22 PM

## 2015-09-19 NOTE — Discharge Instructions (Signed)
Vaginal Bleeding During Pregnancy, Second Trimester ° A small amount of bleeding (spotting) from the vagina is common in pregnancy. Sometimes the bleeding is normal and is not a problem, and sometimes it is a sign of something serious. Be sure to tell your doctor about any bleeding from your vagina right away. °HOME CARE °· Watch your condition for any changes. °· Follow your doctor's instructions about how active you can be. °· If you are on bed rest: °¨ You may need to stay in bed and only get up to use the bathroom. °¨ You may be allowed to do some activities. °¨ If you need help, make plans for someone to help you. °· Write down: °¨ The number of pads you use each day. °¨ How often you change pads. °¨ How soaked (saturated) your pads are. °· Do not use tampons. °· Do not douche. °· Do not have sex or orgasms until your doctor says it is okay. °· If you pass any tissue from your vagina, save the tissue so you can show it to your doctor. °· Only take medicines as told by your doctor. °· Do not take aspirin because it can make you bleed. °· Do not exercise, lift heavy weights, or do any activities that take a lot of energy and effort unless your doctor says it is okay. °· Keep all follow-up visits as told by your doctor. °GET HELP IF:  °· You bleed from your vagina. °· You have cramps. °· You have labor pains. °· You have a fever that does not go away after you take medicine. °GET HELP RIGHT AWAY IF: °· You have very bad cramps in your back or belly (abdomen). °· You have contractions. °· You have chills. °· You pass large clots or tissue from your vagina. °· You bleed more. °· You feel light-headed or weak. °· You pass out (faint). °· You are leaking fluid or have a gush of fluid from your vagina. °MAKE SURE YOU: °· Understand these instructions. °· Will watch your condition. °· Will get help right away if you are not doing well or get worse. °  °This information is not intended to replace advice given to you by  your health care provider. Make sure you discuss any questions you have with your health care provider. °  °Document Released: 11/16/2013 Document Reviewed: 11/16/2013 °Elsevier Interactive Patient Education ©2016 Elsevier Inc. ° °

## 2015-09-21 ENCOUNTER — Ambulatory Visit (INDEPENDENT_AMBULATORY_CARE_PROVIDER_SITE_OTHER): Payer: Medicaid Other | Admitting: Advanced Practice Midwife

## 2015-09-21 VITALS — BP 106/69 | HR 79 | Temp 98.6°F | Wt 190.5 lb

## 2015-09-21 DIAGNOSIS — Z3482 Encounter for supervision of other normal pregnancy, second trimester: Secondary | ICD-10-CM

## 2015-09-21 DIAGNOSIS — K0889 Other specified disorders of teeth and supporting structures: Secondary | ICD-10-CM

## 2015-09-21 LAB — POCT URINALYSIS DIP (DEVICE)
BILIRUBIN URINE: NEGATIVE
Glucose, UA: NEGATIVE mg/dL
HGB URINE DIPSTICK: NEGATIVE
Ketones, ur: NEGATIVE mg/dL
LEUKOCYTES UA: NEGATIVE
Nitrite: NEGATIVE
Protein, ur: NEGATIVE mg/dL
SPECIFIC GRAVITY, URINE: 1.025 (ref 1.005–1.030)
Urobilinogen, UA: 0.2 mg/dL (ref 0.0–1.0)
pH: 6 (ref 5.0–8.0)

## 2015-09-21 NOTE — Progress Notes (Signed)
States came to MAU because she had spotting, none since then. Declines flu shot today.

## 2015-09-21 NOTE — Progress Notes (Signed)
Subjective:  Haley Greer is a 35 y.o. [id number] at 2676w1d being seen today for ongoing prenatal care.  She is currently monitored for the following issues for this low-risk pregnancy and has Supervision of normal subsequent pregnancy; Rh negative status during pregnancy in second trimester, antepartum; and Toothache on her problem list.  Patient reports no complaints.  Contractions: Irregular. Vag. Bleeding: Other.  Movement: Present. Denies leaking of fluid.   The following portions of the patient's history were reviewed and updated as appropriate: allergies, current medications, past family history, past medical history, past social history, past surgical history and problem list. Problem list updated.  Objective:   Filed Vitals:   09/21/15 1405  BP: 106/69  Pulse: 79  Temp: 98.6 F (37 C)  Weight: 190 lb 8 oz (86.41 kg)    Fetal Status: Fetal Heart Rate (bpm): 159   Movement: Present     General:  Alert, oriented and cooperative. Patient is in no acute distress.  Skin: Skin is warm and dry. No rash noted.   Cardiovascular: Normal heart rate noted  Respiratory: Normal respiratory effort, no problems with respiration noted  Abdomen: Soft, gravid, appropriate for gestational age. Pain/Pressure: Present     Pelvic: Vag. Bleeding: Other     Cervical exam deferred        Extremities: Normal range of motion.  Edema: Mild pitting, slight indentation  Mental Status: Normal mood and affect. Normal behavior. Normal judgment and thought content.   Urinalysis:      Assessment and Plan:  Pregnancy: [id number] at 2876w1d  1. Encounter for supervision of other normal pregnancy in second trimester  - US MFM OB FOLLOW UP; Future  2. Toothache --[facility]    Preterm labor symptoms and general obstetric precautions including but not limited to vaginal bleeding, contractions, leaking of fluid and fetal movement were reviewed in detail with the patient. Please  refer to After Visit Summary for other counseling recommendations.  Return in about 4 weeks (around 10/19/2015).   Hurshel PartyLisa A Leftwich-Kirby, CNM

## 2015-10-04 ENCOUNTER — Ambulatory Visit (HOSPITAL_COMMUNITY)
Admission: RE | Admit: 2015-10-04 | Discharge: 2015-10-04 | Disposition: A | Discharge status: Home / Self Care | Payer: Medicaid Other | Source: Ambulatory Visit | Attending: Advanced Practice Midwife | Admitting: Advanced Practice Midwife

## 2015-10-04 ENCOUNTER — Other Ambulatory Visit: Payer: Self-pay | Admitting: Advanced Practice Midwife

## 2015-10-04 DIAGNOSIS — Z3A23 23 weeks gestation of pregnancy: Secondary | ICD-10-CM | POA: Insufficient documentation

## 2015-10-04 DIAGNOSIS — Z3482 Encounter for supervision of other normal pregnancy, second trimester: Secondary | ICD-10-CM

## 2015-10-04 DIAGNOSIS — O99322 Drug use complicating pregnancy, second trimester: Secondary | ICD-10-CM

## 2015-10-04 DIAGNOSIS — O99332 Smoking (tobacco) complicating pregnancy, second trimester: Secondary | ICD-10-CM | POA: Insufficient documentation

## 2015-10-19 ENCOUNTER — Ambulatory Visit (INDEPENDENT_AMBULATORY_CARE_PROVIDER_SITE_OTHER): Payer: Self-pay | Admitting: Family

## 2015-10-19 VITALS — BP 125/65 | HR 82 | Wt 201.4 lb

## 2015-10-19 DIAGNOSIS — Z3482 Encounter for supervision of other normal pregnancy, second trimester: Secondary | ICD-10-CM

## 2015-10-19 NOTE — Progress Notes (Signed)
Subjective:  Haley Greer is a 35 y.o. [phone] at 3754w1d being seen today for ongoing prenatal care.  She is currently monitored for the following issues for this low-risk pregnancy and has Supervision of normal subsequent pregnancy; Rh negative status during pregnancy in second trimester, antepartum; and Toothache on her problem list.  Patient reports no complaints.  Contractions: Not present. Vag. Bleeding: None.  Movement: Present. Denies leaking of fluid.   The following portions of the patient's history were reviewed and updated as appropriate: allergies, current medications, past family history, past medical history, past social history, past surgical history and problem list. Problem list updated.  Objective:   Filed Vitals:   10/19/15 1519  BP: 125/65  Pulse: 82  Weight: 201 lb 6.4 oz (91.354 kg)    Fetal Status: Fetal Heart Rate (bpm): 153 Fundal Height: 27 cm Movement: Present     General:  Alert, oriented and cooperative. Patient is in no acute distress.  Skin: Skin is warm and dry. No rash noted.   Cardiovascular: Normal heart rate noted  Respiratory: Normal respiratory effort, no problems with respiration noted  Abdomen: Soft, gravid, appropriate for gestational age. Pain/Pressure: Absent     Pelvic: Vag. Bleeding: None     Cervical exam deferred        Extremities: Normal range of motion.  Edema: None  Mental Status: Normal mood and affect. Normal behavior. Normal judgment and thought content.   Urinalysis:     Urine results not available at discharge.    Assessment and Plan:  Pregnancy: [id number] at 1854w1d  1. Encounter for supervision of other normal pregnancy in second trimester - Continue monitoring - Discussed third trimester labs at next visit  Preterm labor symptoms and general obstetric precautions including but not limited to vaginal bleeding, contractions, leaking of fluid and fetal movement were reviewed in detail with the patient. Please refer to  After Visit Summary for other counseling recommendations.  Return in about 3 weeks (around 11/09/2015).   Eino FarberWalidah Kennith GainN Karim, CNM

## 2015-10-19 NOTE — Patient Instructions (Signed)
AREA PEDIATRIC/FAMILY PRACTICE PHYSICIANS  [facility] [address] [place], [place] [address] Phone - [phone]   Fax - [phone]  [facility] [address] [place], [place]  [address] Phone - [phone]   Fax - [phone]  [facility] [address] [place], [place]  [address] Phone - [phone]   Fax - [phone]  [facility] [address] [place], [place]  [address] Phone - [phone]   Fax - [phone]  [facility] [address] [place], [place]  [address] Phone - [phone]   Fax - [phone]  [facility] [address] [place], [place]  [address] Phone - [phone]   Fax - [phone]  [facility] [address] [place], [place]  [address] Phone - [phone]   Fax - [phone]  [facility] [address] [place], [place]  [address] Phone - [phone]   Fax - [phone]  [facility] [address] [place], [place]  [address] Phone - [phone]   Fax - [phone] [facility] [address] [place], [place]  [address] Phone - [phone]   Fax - [phone]  [facility] [address] [place], [place]  [address] Phone - [phone]   Fax - [phone]  [facility] [address] [place], [place]  [address] Phone - [phone]   Fax - [phone]  [facility] [address], [address] [place], [place]  [address] Phone - [phone]   Fax - [phone]  [facility] G[facility] [address] [place], [place]  [address] Phone - [phone]  [facility] [address] [place], [place]  [address] Phone - [phone]   Fax - [phone]  [facility] [address] [place], [place]  [address] Phone - [phone]   Fax - [phone]  [facility] [address] [place], [place]  [address] Phone - [phone]   Fax - [phone]  [facility] [address] [place], [place]  [address] Phone - [phone]   Fax - [phone]   [facility] [address] [place], [place]  [address] Phone - [phone]   Fax - [phone]  [facility] [address] [place], [place]  [address] Phone - [phone]   Fax - [phone]  [facility] [address] [place], [place]  [address] Phone - [phone]   Fax - [phone]  [facility] [address] [place], [place]  [address] Phone - [phone]   Fax - [phone]  [facility] [address] [place], [place]  [address] Phone - [phone]   Fax - [phone] [facility] [address] [place], [place]  [address] Phone - [phone]   Fax - [phone]  [facility] [address] [place], [place]  [address] Phone - [phone]   Fax - [phone]  [facility] [address] [place], [place]  [address] Phone - [phone]   Fax - [phone]   

## 2015-11-09 ENCOUNTER — Other Ambulatory Visit: Payer: Self-pay

## 2015-11-09 ENCOUNTER — Ambulatory Visit (INDEPENDENT_AMBULATORY_CARE_PROVIDER_SITE_OTHER): Payer: Self-pay | Admitting: Advanced Practice Midwife

## 2015-11-09 VITALS — BP 113/63 | HR 71 | Wt 201.0 lb

## 2015-11-09 DIAGNOSIS — O360120 Maternal care for anti-D [Rh] antibodies, second trimester, not applicable or unspecified: Secondary | ICD-10-CM

## 2015-11-09 DIAGNOSIS — Z3483 Encounter for supervision of other normal pregnancy, third trimester: Secondary | ICD-10-CM

## 2015-11-09 LAB — CBC
HEMATOCRIT: 35.5 % (ref 35.0–45.0)
Hemoglobin: 11.7 g/dL (ref 11.7–15.5)
MCH: 30 pg (ref 27.0–33.0)
MCHC: 33 g/dL (ref 32.0–36.0)
MCV: 91 fL (ref 80.0–100.0)
MPV: 9.5 fL (ref 7.5–12.5)
Platelets: 190 10*3/uL (ref 140–400)
RBC: 3.9 MIL/uL (ref 3.80–5.10)
RDW: 13.8 % (ref 11.0–15.0)
WBC: 10.4 10*3/uL (ref 3.8–10.8)

## 2015-11-09 LAB — POCT URINALYSIS DIP (DEVICE)
Bilirubin Urine: NEGATIVE
GLUCOSE, UA: NEGATIVE mg/dL
Hgb urine dipstick: NEGATIVE
KETONES UR: NEGATIVE mg/dL
Leukocytes, UA: NEGATIVE
NITRITE: NEGATIVE
PH: 6.5 (ref 5.0–8.0)
PROTEIN: NEGATIVE mg/dL
Specific Gravity, Urine: 1.02 (ref 1.005–1.030)
UROBILINOGEN UA: 0.2 mg/dL (ref 0.0–1.0)

## 2015-11-09 MED ORDER — RHO D IMMUNE GLOBULIN 1500 UNIT/2ML IJ SOSY
300.0000 ug | PREFILLED_SYRINGE | Freq: Once | INTRAMUSCULAR | Status: AC
  Administered 2015-11-09: 300 ug via INTRAMUSCULAR

## 2015-11-09 NOTE — Progress Notes (Signed)
Decline Flu and Tdap

## 2015-11-10 LAB — HIV ANTIBODY (ROUTINE TESTING W REFLEX): HIV: NONREACTIVE

## 2015-11-10 LAB — RPR

## 2015-11-10 LAB — GLUCOSE TOLERANCE, 1 HOUR (50G) W/O FASTING: GLUCOSE, 1 HR, GESTATIONAL: 92 mg/dL (ref <140)

## 2015-11-11 NOTE — Progress Notes (Signed)
Subjective:  Haley Greer is a 35 y.o. [phone] at 7477w3d being seen today for ongoing prenatal care.  She is currently monitored for the following issues for this low-risk pregnancy and has Supervision of normal subsequent pregnancy; Rh negative status during pregnancy in second trimester, antepartum; and Toothache on her problem list.  Patient reports no complaints.  Contractions: Not present.  .  Movement: Present. Denies leaking of fluid.   The following portions of the patient's history were reviewed and updated as appropriate: allergies, current medications, past family history, past medical history, past social history, past surgical history and problem list. Problem list updated.  Objective:   Filed Vitals:   11/09/15 1046  BP: 113/63  Pulse: 71  Weight: 201 lb (91.173 kg)    Fetal Status: Fetal Heart Rate (bpm): 125 Fundal Height: 28 cm Movement: Present     General:  Alert, oriented and cooperative. Patient is in no acute distress.  Skin: Skin is warm and dry. No rash noted.   Cardiovascular: Normal heart rate noted  Respiratory: Normal respiratory effort, no problems with respiration noted  Abdomen: Soft, gravid, appropriate for gestational age. Pain/Pressure: Absent     Pelvic:       Cervical exam deferred        Extremities: Normal range of motion.     Mental Status: Normal mood and affect. Normal behavior. Normal judgment and thought content.   Urinalysis:      Assessment and Plan:  Pregnancy: [id number] at 5377w3d  1. Encounter for supervision of other normal pregnancy in third trimester  - Glucose Tolerance, 1 HR (50g) - CBC - HIV antibody - RPR - POCT urinalysis dip (device)  2. Rh negative status during pregnancy in second trimester, antepartum, not applicable or unspecified fetus  - rho (d) immune globulin (RHIG/RHOPHYLAC) injection 300 mcg; Inject 2 mLs (300 mcg total) into the muscle once.  Preterm labor symptoms and general obstetric precautions  including but not limited to vaginal bleeding, contractions, leaking of fluid and fetal movement were reviewed in detail with the patient. Please refer to After Visit Summary for other counseling recommendations.  Return in about 2 weeks (around 11/23/2015).   Hurshel PartyLisa A Leftwich-Kirby, CNM

## 2015-11-23 ENCOUNTER — Encounter: Payer: Self-pay | Admitting: Advanced Practice Midwife

## 2015-11-23 ENCOUNTER — Ambulatory Visit (INDEPENDENT_AMBULATORY_CARE_PROVIDER_SITE_OTHER): Payer: Medicaid Other | Admitting: Advanced Practice Midwife

## 2015-11-23 VITALS — BP 109/65 | HR 76 | Wt 199.9 lb

## 2015-11-23 DIAGNOSIS — Z23 Encounter for immunization: Secondary | ICD-10-CM | POA: Diagnosis present

## 2015-11-23 DIAGNOSIS — Z3483 Encounter for supervision of other normal pregnancy, third trimester: Secondary | ICD-10-CM

## 2015-11-23 DIAGNOSIS — O360120 Maternal care for anti-D [Rh] antibodies, second trimester, not applicable or unspecified: Secondary | ICD-10-CM

## 2015-11-23 LAB — POCT URINALYSIS DIP (DEVICE)
Bilirubin Urine: NEGATIVE
GLUCOSE, UA: NEGATIVE mg/dL
Hgb urine dipstick: NEGATIVE
Ketones, ur: NEGATIVE mg/dL
Leukocytes, UA: NEGATIVE
Nitrite: NEGATIVE
PH: 6 (ref 5.0–8.0)
PROTEIN: NEGATIVE mg/dL
Specific Gravity, Urine: 1.02 (ref 1.005–1.030)
UROBILINOGEN UA: 0.2 mg/dL (ref 0.0–1.0)

## 2015-11-23 MED ORDER — TETANUS-DIPHTH-ACELL PERTUSSIS 5-2.5-18.5 LF-MCG/0.5 IM SUSP
0.5000 mL | Freq: Once | INTRAMUSCULAR | Status: AC
  Administered 2015-11-23: 0.5 mL via INTRAMUSCULAR

## 2015-11-23 NOTE — Patient Instructions (Signed)

## 2015-11-23 NOTE — Progress Notes (Signed)
Breastfeeding tip reviewed 

## 2015-11-23 NOTE — Progress Notes (Signed)
Subjective:  Haley Greer is a 35 y.o. [phone] at 5065w1d being seen today for ongoing prenatal care.  She is currently monitored for the following issues for this low-risk pregnancy and has Supervision of normal subsequent pregnancy; Rh negative status during pregnancy in second trimester, antepartum; and Toothache on her problem list.  Patient reports no complaints.  Contractions: Not present. Vag. Bleeding: None.  Movement: Present. Denies leaking of fluid.   The following portions of the patient's history were reviewed and updated as appropriate: allergies, current medications, past family history, past medical history, past social history, past surgical history and problem list. Problem list updated.  Objective:   Filed Vitals:   11/23/15 1205  BP: 109/65  Pulse: 76  Weight: 199 lb 14.4 oz (90.674 kg)    Fetal Status: Fetal Heart Rate (bpm): 143   Movement: Present     General:  Alert, oriented and cooperative. Patient is in no acute distress.  Skin: Skin is warm and dry. No rash noted.   Cardiovascular: Normal heart rate noted  Respiratory: Normal respiratory effort, no problems with respiration noted  Abdomen: Soft, gravid, appropriate for gestational age. Pain/Pressure: Present     Pelvic: Vag. Bleeding: None     Cervical exam deferred        Extremities: Normal range of motion.  Edema: Trace  Mental Status: Normal mood and affect. Normal behavior. Normal judgment and thought content.   Urinalysis:      Assessment and Plan:  Pregnancy: [id number] at 5965w1d  1. Need for Tdap vaccination     Done today - Tdap (BOOSTRIX) injection 0.5 mL; Inject 0.5 mLs into the muscle once.  2. Rh negative status during pregnancy in second trimester, antepartum, not applicable or unspecified fetus     Had Rhig last visit  3. Encounter for supervision of other normal pregnancy in third trimester      US rescheduled next week for growth - US MFM OB FOLLOW UP; Future  Preterm labor  symptoms and general obstetric precautions including but not limited to vaginal bleeding, contractions, leaking of fluid and fetal movement were reviewed in detail with the patient. Please refer to After Visit Summary for other counseling recommendations.  Return in about 2 weeks (around 12/07/2015) for [facility].   Haley Greer, CNM

## 2015-12-01 ENCOUNTER — Ambulatory Visit (HOSPITAL_COMMUNITY)
Admission: RE | Admit: 2015-12-01 | Discharge: 2015-12-01 | Disposition: A | Discharge status: Home / Self Care | Payer: Medicaid Other | Source: Ambulatory Visit | Attending: Advanced Practice Midwife | Admitting: Advanced Practice Midwife

## 2015-12-01 ENCOUNTER — Other Ambulatory Visit: Payer: Self-pay | Admitting: Advanced Practice Midwife

## 2015-12-01 DIAGNOSIS — Z3483 Encounter for supervision of other normal pregnancy, third trimester: Secondary | ICD-10-CM

## 2015-12-01 DIAGNOSIS — Z3A31 31 weeks gestation of pregnancy: Secondary | ICD-10-CM

## 2015-12-01 DIAGNOSIS — O99323 Drug use complicating pregnancy, third trimester: Secondary | ICD-10-CM

## 2015-12-01 DIAGNOSIS — O99333 Smoking (tobacco) complicating pregnancy, third trimester: Secondary | ICD-10-CM | POA: Diagnosis not present

## 2015-12-14 ENCOUNTER — Encounter: Payer: Medicaid Other | Admitting: Medical

## 2015-12-21 ENCOUNTER — Encounter: Payer: Medicaid Other | Admitting: Advanced Practice Midwife

## 2015-12-29 ENCOUNTER — Inpatient Hospital Stay (HOSPITAL_COMMUNITY)
Admission: AD | Admit: 2015-12-29 | Discharge: 2015-12-29 | Disposition: A | Discharge status: Home / Self Care | Payer: Medicaid Other | Source: Ambulatory Visit | Attending: Obstetrics & Gynecology | Admitting: Obstetrics & Gynecology

## 2015-12-29 ENCOUNTER — Encounter (HOSPITAL_COMMUNITY): Payer: Self-pay

## 2015-12-29 DIAGNOSIS — N949 Unspecified condition associated with female genital organs and menstrual cycle: Secondary | ICD-10-CM

## 2015-12-29 DIAGNOSIS — Z3A35 35 weeks gestation of pregnancy: Secondary | ICD-10-CM

## 2015-12-29 DIAGNOSIS — R51 Headache: Secondary | ICD-10-CM

## 2015-12-29 DIAGNOSIS — R102 Pelvic and perineal pain: Secondary | ICD-10-CM | POA: Insufficient documentation

## 2015-12-29 DIAGNOSIS — O26893 Other specified pregnancy related conditions, third trimester: Secondary | ICD-10-CM | POA: Diagnosis not present

## 2015-12-29 DIAGNOSIS — O9989 Other specified diseases and conditions complicating pregnancy, childbirth and the puerperium: Secondary | ICD-10-CM | POA: Diagnosis not present

## 2015-12-29 DIAGNOSIS — Z87891 Personal history of nicotine dependence: Secondary | ICD-10-CM | POA: Insufficient documentation

## 2015-12-29 DIAGNOSIS — R109 Unspecified abdominal pain: Secondary | ICD-10-CM | POA: Diagnosis present

## 2015-12-29 LAB — URINE MICROSCOPIC-ADD ON: WBC, UA: NONE SEEN WBC/hpf (ref 0–5)

## 2015-12-29 LAB — URINALYSIS, ROUTINE W REFLEX MICROSCOPIC
Bilirubin Urine: NEGATIVE
GLUCOSE, UA: NEGATIVE mg/dL
Ketones, ur: NEGATIVE mg/dL
Leukocytes, UA: NEGATIVE
Nitrite: NEGATIVE
PH: 5.5 (ref 5.0–8.0)
Protein, ur: NEGATIVE mg/dL

## 2015-12-29 MED ORDER — ACETAMINOPHEN 500 MG PO TABS
1000.0000 mg | ORAL_TABLET | Freq: Once | ORAL | Status: AC
  Administered 2015-12-29: 1000 mg via ORAL
  Filled 2015-12-29: qty 2

## 2015-12-29 NOTE — Discharge Instructions (Signed)

## 2015-12-29 NOTE — MAU Provider Note (Signed)
History    CSN: [id number]  Arrival date and time: 12/29/15 1410   First Provider Initiated Contact with Patient 12/29/15 1538     Chief Complaint  Patient presents with  . Headache  . Nausea   HPI    [name] is a 35 y.o. female [phone] @ 8241w2d here with lower abdominal cramping, HA, and nausea. The cramping began 2 days ago and occurs in the round ligament area about 4-5x / hr and lasts for a couple of minutes at a time. Movement/position does not aggravate or relieve the pain. She rates the pain a 7/10. States she was awakened by the pain last night. Headache and nausea have been present for the past 3 days. The headache has been constant and is in the low occipital region just above the neck. She has not taken anything for pain and says that cold packs seem to help. She reports a long hx of migraines but states that they have always started behind the eyes. Is unsure if the nausea is related to the headaches or not. Denies any vomiting. She also reports 3 episodes of diarrhea since yesterday and states that she was fairly regular prior to this. Denies vaginal bleeding, loss of fluids. Reports strong and frequent fetal movements. Denies any dizziness, change in vision.   States that she wanted to make sure everything was okay because of her hx of 2 SABs. She is currently only taking prenatal vitamins.      OB History    Gravida Para Term Preterm AB TAB SAB Ectopic Multiple Living   6 3 3  2  2   0 3      Past Medical History  Diagnosis Date  . Migraine     Past Surgical History  Procedure Laterality Date  . Cholecystectomy      Family History  Problem Relation Age of Onset  . Diabetes Mother   . Heart disease Father     Social History  Substance Use Topics  . Smoking status: Former Smoker -- 0.50 packs/day    Types: Cigarettes    Quit date: 12/22/2015  . Smokeless tobacco: Never Used  . 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 Prenatal Vit-Fe Fumarate-FA (PRENATAL MULTIVITAMIN) TABS tablet Take 1 tablet by mouth daily at 12 noon. Reported on 07/26/2015   12/29/2015 at Unknown time   Results for orders placed or performed during the hospital encounter of 12/29/15 (from the past 48 hour(s))  Urinalysis, Routine w reflex microscopic ([facility])     Status: Abnormal   Collection Time: 12/29/15  2:10 PM  Result Value Ref Range   Color, Urine YELLOW YELLOW   APPearance CLEAR CLEAR   Specific Gravity, Urine <1.005 (L) 1.005 - 1.030   pH 5.5 5.0 - 8.0   Glucose, UA NEGATIVE NEGATIVE mg/dL   Hgb urine dipstick SMALL (A) NEGATIVE   Bilirubin Urine NEGATIVE NEGATIVE   Ketones, ur NEGATIVE NEGATIVE mg/dL   Protein, ur NEGATIVE NEGATIVE mg/dL   Nitrite NEGATIVE NEGATIVE   Leukocytes, UA NEGATIVE NEGATIVE  Urine microscopic-add on     Status: Abnormal   Collection Time: 12/29/15  2:10 PM  Result Value Ref Range   Squamous Epithelial / LPF 0-5 (A) NONE SEEN   WBC, UA NONE SEEN 0 - 5 WBC/hpf   RBC / HPF  0-5 0 - 5 RBC/hpf   Bacteria, UA RARE (A) NONE SEEN    Review of Systems  Constitutional: Negative for fever and chills.  Eyes: Negative for blurred vision, double vision and photophobia.  Respiratory: Negative for shortness of breath.   Cardiovascular: Positive for leg swelling (occasional and mild). Negative for chest pain.  Gastrointestinal: Positive for nausea, abdominal pain and diarrhea. Negative for vomiting, constipation and blood in stool.  Genitourinary: Negative for dysuria, urgency, frequency, hematuria and flank pain.  Skin: Negative for rash.  Neurological: Positive for headaches. Negative for dizziness and focal weakness.   Physical Exam   Blood pressure 105/68, pulse 70, temperature 97.5 F (36.4 C), temperature source Oral, resp. rate 18, height  (1.803 m), weight 206 lb (93.441 kg), last  menstrual period 04/26/2015, unknown if currently breastfeeding.  Physical Exam  Constitutional: She is oriented to person, place, and time. She appears well-developed and well-nourished.  HENT:  Head: Normocephalic and atraumatic.  Eyes: EOM are normal.  Neck: Normal range of motion. Neck supple.  Cardiovascular: Normal rate and regular rhythm.   Respiratory: Effort normal and breath sounds normal.  GI: Soft. Bowel sounds are normal. She exhibits no distension and no mass. There is no tenderness. There is no rebound and no guarding.  Genitourinary:  Cervix, closed, thick, posterior   Neurological: She is alert and oriented to person, place, and time.  Skin: Skin is warm and dry. No rash noted.  Psychiatric: She has a normal mood and affect. Her behavior is normal.   Fetal Tracing: Baseline: 135 bpm Variability: Moderate  Accelerations: 15x15 Decelerations: none Toco: quiet   MAU Course  Procedures  None  MDM Fetal nonstress test performed, reactive with moderate variability and no evidence of contractions. Patient was offered Phenergan and Tylenol for relief of headache and nausea.  Stated that her nausea is not currently present and that she really would prefer not to take any medications. She declined.   Assessment and Plan   A:   1. Round ligament pain   2. Headache in pregnancy, antepartum, third trimester   3. [redacted] weeks gestation of pregnancy     P:  Discharge home in stable condition  Advised that intermittent cramping or pain in this area is common and usually normal at this stage of pregnancy. Patient advised to followed up in clinic at upcoming scheduled prenatal appointment or return to MAU if symptoms worsen. Preterm labor precautions   Duane Lope, NP 12/29/2015 5:12 PM

## 2015-12-29 NOTE — MAU Note (Signed)
Pt c/o headache and nausea for the last three days. Pt c/o lower abdominal cramping that started 2 days ago. Pt denies bleeding and leaking of fluid. Pt states baby is moving normally.

## 2016-01-02 ENCOUNTER — Ambulatory Visit (INDEPENDENT_AMBULATORY_CARE_PROVIDER_SITE_OTHER): Payer: Medicaid Other | Admitting: Medical

## 2016-01-02 ENCOUNTER — Other Ambulatory Visit (HOSPITAL_COMMUNITY)
Admission: RE | Admit: 2016-01-02 | Discharge: 2016-01-02 | Disposition: A | Discharge status: Home / Self Care | Payer: Medicaid Other | Source: Ambulatory Visit | Attending: Medical | Admitting: Medical

## 2016-01-02 VITALS — BP 123/81 | HR 101 | Temp 98.5°F | Wt 212.5 lb

## 2016-01-02 DIAGNOSIS — Z3483 Encounter for supervision of other normal pregnancy, third trimester: Secondary | ICD-10-CM | POA: Diagnosis not present

## 2016-01-02 DIAGNOSIS — Z113 Encounter for screening for infections with a predominantly sexual mode of transmission: Secondary | ICD-10-CM | POA: Insufficient documentation

## 2016-01-02 LAB — POCT URINALYSIS DIP (DEVICE)
Bilirubin Urine: NEGATIVE
GLUCOSE, UA: NEGATIVE mg/dL
HGB URINE DIPSTICK: NEGATIVE
Ketones, ur: NEGATIVE mg/dL
NITRITE: NEGATIVE
PROTEIN: NEGATIVE mg/dL
SPECIFIC GRAVITY, URINE: 1.015 (ref 1.005–1.030)
UROBILINOGEN UA: 0.2 mg/dL (ref 0.0–1.0)
pH: 7 (ref 5.0–8.0)

## 2016-01-02 NOTE — Progress Notes (Signed)
Subjective:  Rise MuChristina M Gair is a 35 y.o. [phone] at 7470w6d being seen today for ongoing prenatal care.  She is currently monitored for the following issues for this low-risk pregnancy and has Supervision of normal subsequent pregnancy; Rh negative status during pregnancy in second trimester, antepartum; and Toothache on her problem list.  Patient reports no complaints. Was seen in MAU last week for cramping, continues to have some irritability with occasional contractions.  Contractions: Irritability. Vag. Bleeding: None.  Movement: Present. Denies leaking of fluid.   The following portions of the patient's history were reviewed and updated as appropriate: allergies, current medications, past family history, past medical history, past social history, past surgical history and problem list. Problem list updated.  Objective:   Filed Vitals:   01/02/16 1456  BP: 123/81  Pulse: 101  Temp: 98.5 F (36.9 C)  Weight: 212 lb 8 oz (96.389 kg)    Fetal Status: Fetal Heart Rate (bpm): 149 Fundal Height: 34 cm Movement: Present     General:  Alert, oriented and cooperative. Patient is in no acute distress.  Skin: Skin is warm and dry. No rash noted.   Cardiovascular: Normal heart rate noted  Respiratory: Normal respiratory effort, no problems with respiration noted  Abdomen: Soft, gravid, appropriate for gestational age. Pain/Pressure: Present     Pelvic: Cervical exam performed Dilation: 1 Effacement (%): Thick Station: Ballotable  Extremities: Normal range of motion.  Edema: Trace  Mental Status: Normal mood and affect. Normal behavior. Normal judgment and thought content.   Urinalysis: Urine Protein: Negative Urine Glucose: Negative  Assessment and Plan:  Pregnancy: [id number] at 170w6d  1. Encounter for supervision of other normal pregnancy in third trimester - Culture, beta strep (group b only) - GC/Chlamydia probe amp ([place]not [facility]  Preterm labor symptoms and general  obstetric precautions including but not limited to vaginal bleeding, contractions, leaking of fluid and fetal movement were reviewed in detail with the patient. Please refer to After Visit Summary for other counseling recommendations.  Return in about 1 week (around 01/09/2016) for LOB.   Marny LowensteinJulie N Marcelle Hepner, PA-C

## 2016-01-02 NOTE — Progress Notes (Signed)
Upon calling patient back she seemed very flustered and demanded her husband to come back to the room with her, i was able to ask domestic screen questions before calling him back but she was very adamant for her husband to come back with her. Asked patient if she was feeling her heart race while taking vitals because of elevated HR but patient just said she wanted her husband... Re stated the question with him in the room and she stated she "was hot."   Trace Leukocytes

## 2016-01-02 NOTE — Patient Instructions (Signed)
Braxton Hicks Contractions °Contractions of the uterus can occur throughout pregnancy. Contractions are not always a sign that you are in labor.  °WHAT ARE BRAXTON HICKS CONTRACTIONS?  °Contractions that occur before labor are called Braxton Hicks contractions, or false labor. Toward the end of pregnancy (32-34 weeks), these contractions can develop more often and may become more forceful. This is not true labor because these contractions do not result in opening (dilatation) and thinning of the cervix. They are sometimes difficult to tell apart from true labor because these contractions can be forceful and people have different pain tolerances. You should not feel embarrassed if you go to the hospital with false labor. Sometimes, the only way to tell if you are in true labor is for your health care provider to look for changes in the cervix. °If there are no prenatal problems or other health problems associated with the pregnancy, it is completely safe to be sent home with false labor and await the onset of true labor. °HOW CAN YOU TELL THE DIFFERENCE BETWEEN TRUE AND FALSE LABOR? °False Labor °· The contractions of false labor are usually shorter and not as hard as those of true labor.   °· The contractions are usually irregular.   °· The contractions are often felt in the front of the lower abdomen and in the groin.   °· The contractions may go away when you walk around or change positions while lying down.   °· The contractions get weaker and are shorter lasting as time goes on.   °· The contractions do not usually become progressively stronger, regular, and closer together as with true labor.   °True Labor °· Contractions in true labor last 30-70 seconds, become very regular, usually become more intense, and increase in frequency.   °· The contractions do not go away with walking.   °· The discomfort is usually felt in the top of the uterus and spreads to the lower abdomen and low back.   °· True labor can be  determined by your health care provider with an exam. This will show that the cervix is dilating and getting thinner.   °WHAT TO REMEMBER °· Keep up with your usual exercises and follow other instructions given by your health care provider.   °· Take medicines as directed by your health care provider.   °· Keep your regular prenatal appointments.   °· Eat and drink lightly if you think you are going into labor.   °· If Braxton Hicks contractions are making you uncomfortable:   °¨ Change your position from lying down or resting to walking, or from walking to resting.   °¨ Sit and rest in a tub of warm water.   °¨ Drink 2-3 glasses of water. Dehydration may cause these contractions.   °¨ Do slow and deep breathing several times an hour.   °WHEN SHOULD I SEEK IMMEDIATE MEDICAL CARE? °Seek immediate medical care if: °· Your contractions become stronger, more regular, and closer together.   °· You have fluid leaking or gushing from your vagina.   °· You have a fever.   °· You pass blood-tinged mucus.   °· You have vaginal bleeding.   °· You have continuous abdominal pain.   °· You have low back pain that you never had before.   °· You feel your baby's head pushing down and causing pelvic pressure.   °· Your baby is not moving as much as it used to.   °  °This information is not intended to replace advice given to you by your health care provider. Make sure you discuss any questions you have with your health care   provider. °  °Document Released: 07/02/2005 Document Revised: 07/07/2013 Document Reviewed: 04/13/2013 °Elsevier Interactive Patient Education ©2016 [employer] °Fetal Movement Counts °Patient Name: __________________________________________________ Patient Due Date: ____________________ °Performing a fetal movement count is highly recommended in high-risk pregnancies, but it is good for every pregnant woman to do. Your health care provider may ask you to start counting fetal movements at 28 weeks of the  pregnancy. Fetal movements often increase: °· After eating a full meal. °· After physical activity. °· After eating or drinking something sweet or cold. °· At rest. °Pay attention to when you feel the baby is most active. This will help you notice a pattern of your baby's sleep and wake cycles and what factors contribute to an increase in fetal movement. It is important to perform a fetal movement count at the same time each day when your baby is normally most active.  °HOW TO COUNT FETAL MOVEMENTS °· Find a quiet and comfortable area to sit or lie down on your left side. Lying on your left side provides the best blood and oxygen circulation to your baby. °· Write down the day and time on a sheet of paper or in a journal. °· Start counting kicks, flutters, swishes, rolls, or jabs in a 2-hour period. You should feel at least 10 movements within 2 hours. °· If you do not feel 10 movements in 2 hours, wait 2-3 hours and count again. Look for a change in the pattern or not enough counts in 2 hours. °SEEK MEDICAL CARE IF: °· You feel less than 10 counts in 2 hours, tried twice. °· There is no movement in over an hour. °· The pattern is changing or taking longer each day to reach 10 counts in 2 hours. °· You feel the baby is not moving as he or she usually does. °Date: ____________ Movements: ____________ Start time: ____________ Finish time: ____________  °Date: ____________ Movements: ____________ Start time: ____________ Finish time: ____________ °Date: ____________ Movements: ____________ Start time: ____________ Finish time: ____________ °Date: ____________ Movements: ____________ Start time: ____________ Finish time: ____________ °Date: ____________ Movements: ____________ Start time: ____________ Finish time: ____________ °Date: ____________ Movements: ____________ Start time: ____________ Finish time: ____________ °Date: ____________ Movements: ____________ Start time: ____________ Finish time: ____________ °Date:  ____________ Movements: ____________ Start time: ____________ Finish time: ____________  °Date: ____________ Movements: ____________ Start time: ____________ Finish time: ____________ °Date: ____________ Movements: ____________ Start time: ____________ Finish time: ____________ °Date: ____________ Movements: ____________ Start time: ____________ Finish time: ____________ °Date: ____________ Movements: ____________ Start time: ____________ Finish time: ____________ °Date: ____________ Movements: ____________ Start time: ____________ Finish time: ____________ °Date: ____________ Movements: ____________ Start time: ____________ Finish time: ____________ °Date: ____________ Movements: ____________ Start time: ____________ Finish time: ____________  °Date: ____________ Movements: ____________ Start time: ____________ Finish time: ____________ °Date: ____________ Movements: ____________ Start time: ____________ Finish time: ____________ °Date: ____________ Movements: ____________ Start time: ____________ Finish time: ____________ °Date: ____________ Movements: ____________ Start time: ____________ Finish time: ____________ °Date: ____________ Movements: ____________ Start time: ____________ Finish time: ____________ °Date: ____________ Movements: ____________ Start time: ____________ Finish time: ____________ °Date: ____________ Movements: ____________ Start time: ____________ Finish time: ____________  °Date: ____________ Movements: ____________ Start time: ____________ Finish time: ____________ °Date: ____________ Movements: ____________ Start time: ____________ Finish time: ____________ °Date: ____________ Movements: ____________ Start time: ____________ Finish time: ____________ °Date: ____________ Movements: ____________ Start time: ____________ Finish time: ____________ °Date: ____________ Movements: ____________ Start time: ____________ Finish time: ____________ °Date: ____________ Movements: ____________ Start  time: ____________ Finish time:   ____________ °Date: ____________ Movements: ____________ Start time: ____________ Finish time: ____________  °Date: ____________ Movements: ____________ Start time: ____________ Finish time: ____________ °Date: ____________ Movements: ____________ Start time: ____________ Finish time: ____________ °Date: ____________ Movements: ____________ Start time: ____________ Finish time: ____________ °Date: ____________ Movements: ____________ Start time: ____________ Finish time: ____________ °Date: ____________ Movements: ____________ Start time: ____________ Finish time: ____________ °Date: ____________ Movements: ____________ Start time: ____________ Finish time: ____________ °Date: ____________ Movements: ____________ Start time: ____________ Finish time: ____________  °Date: ____________ Movements: ____________ Start time: ____________ Finish time: ____________ °Date: ____________ Movements: ____________ Start time: ____________ Finish time: ____________ °Date: ____________ Movements: ____________ Start time: ____________ Finish time: ____________ °Date: ____________ Movements: ____________ Start time: ____________ Finish time: ____________ °Date: ____________ Movements: ____________ Start time: ____________ Finish time: ____________ °Date: ____________ Movements: ____________ Start time: ____________ Finish time: ____________ °Date: ____________ Movements: ____________ Start time: ____________ Finish time: ____________  °Date: ____________ Movements: ____________ Start time: ____________ Finish time: ____________ °Date: ____________ Movements: ____________ Start time: ____________ Finish time: ____________ °Date: ____________ Movements: ____________ Start time: ____________ Finish time: ____________ °Date: ____________ Movements: ____________ Start time: ____________ Finish time: ____________ °Date: ____________ Movements: ____________ Start time: ____________ Finish time:  ____________ °Date: ____________ Movements: ____________ Start time: ____________ Finish time: ____________ °Date: ____________ Movements: ____________ Start time: ____________ Finish time: ____________  °Date: ____________ Movements: ____________ Start time: ____________ Finish time: ____________ °Date: ____________ Movements: ____________ Start time: ____________ Finish time: ____________ °Date: ____________ Movements: ____________ Start time: ____________ Finish time: ____________ °Date: ____________ Movements: ____________ Start time: ____________ Finish time: ____________ °Date: ____________ Movements: ____________ Start time: ____________ Finish time: ____________ °Date: ____________ Movements: ____________ Start time: ____________ Finish time: ____________ °  °This information is not intended to replace advice given to you by your health care provider. Make sure you discuss any questions you have with your health care provider. °  °Document Released: 08/01/2006 Document Revised: 07/23/2014 Document Reviewed: 04/28/2012 °Elsevier Interactive Patient Education ©2016 [employer] ° °

## 2016-01-03 LAB — GC/CHLAMYDIA PROBE AMP (~~LOC~~) NOT AT ARMC
Chlamydia: NEGATIVE
Neisseria Gonorrhea: NEGATIVE

## 2016-01-04 LAB — CULTURE, BETA STREP (GROUP B ONLY)

## 2016-01-16 ENCOUNTER — Ambulatory Visit (INDEPENDENT_AMBULATORY_CARE_PROVIDER_SITE_OTHER): Payer: Medicaid Other | Admitting: Obstetrics and Gynecology

## 2016-01-16 VITALS — BP 107/63 | HR 89 | Wt 214.1 lb

## 2016-01-16 DIAGNOSIS — Z3483 Encounter for supervision of other normal pregnancy, third trimester: Secondary | ICD-10-CM

## 2016-01-16 NOTE — Progress Notes (Signed)
Subjective:  Haley Greer is a 35 y.o. [phone] at 3264w6d being seen today for ongoing prenatal care.  She is currently monitored for the following issues for this low-risk pregnancy and has Supervision of normal subsequent pregnancy; Rh negative status during pregnancy in second trimester, antepartum; and Toothache on her problem list.  Patient reports no complaints.  Contractions: Irritability. Vag. Bleeding: None.  Movement: Present. Denies leaking of fluid.   The following portions of the patient's history were reviewed and updated as appropriate: allergies, current medications, past family history, past medical history, past social history, past surgical history and problem list. Problem list updated.  Objective:   Filed Vitals:   01/16/16 1407  BP: 107/63  Pulse: 89  Weight: 214 lb 1.6 oz (97.115 kg)    Fetal Status: Fetal Heart Rate (bpm): 154   Movement: Present     General:  Alert, oriented and cooperative. Patient is in no acute distress.  Skin: Skin is warm and dry. No rash noted.   Cardiovascular: Normal heart rate noted  Respiratory: Normal respiratory effort, no problems with respiration noted  Abdomen: Soft, gravid, appropriate for gestational age. Pain/Pressure: Present     Pelvic: Cervical exam deferred        Extremities: Normal range of motion.  Edema: Trace  Mental Status: Normal mood and affect. Normal behavior. Normal judgment and thought content.   Urinalysis:      Assessment and Plan:  Pregnancy: [id number] at 5164w6d  # pregnancy - g, c, gbs neg  There are no diagnoses linked to this encounter. Term labor symptoms and general obstetric precautions including but not limited to vaginal bleeding, contractions, leaking of fluid and fetal movement were reviewed in detail with the patient. Please refer to After Visit Summary for other counseling recommendations.  F/u 1 wk  Kathrynn RunningNoah Bedford Laine Fonner, MD

## 2016-01-23 ENCOUNTER — Ambulatory Visit (INDEPENDENT_AMBULATORY_CARE_PROVIDER_SITE_OTHER): Payer: Medicaid Other | Admitting: Obstetrics & Gynecology

## 2016-01-23 ENCOUNTER — Encounter: Payer: Self-pay | Admitting: Obstetrics & Gynecology

## 2016-01-23 VITALS — BP 107/71 | HR 65 | Wt 218.7 lb

## 2016-01-23 DIAGNOSIS — Z3483 Encounter for supervision of other normal pregnancy, third trimester: Secondary | ICD-10-CM

## 2016-01-23 LAB — POCT URINALYSIS DIP (DEVICE)
BILIRUBIN URINE: NEGATIVE
Glucose, UA: NEGATIVE mg/dL
Hgb urine dipstick: NEGATIVE
KETONES UR: NEGATIVE mg/dL
LEUKOCYTES UA: NEGATIVE
NITRITE: NEGATIVE
PH: 6 (ref 5.0–8.0)
Protein, ur: NEGATIVE mg/dL
Specific Gravity, Urine: 1.015 (ref 1.005–1.030)
Urobilinogen, UA: 0.2 mg/dL (ref 0.0–1.0)

## 2016-01-23 NOTE — Patient Instructions (Signed)
Vaginal Delivery °During delivery, your health care provider will help you give birth to your baby. During a vaginal delivery, you will work to push the baby out of your vagina. However, before you can push your baby out, a few things need to happen. The opening of your uterus (cervix) has to soften, thin out, and open up (dilate) all the way to 10 cm. Also, your baby has to move down from the uterus into your vagina.  °SIGNS OF LABOR  °Your health care provider will first need to make sure you are in labor. Signs of labor include:  °· Passing what is called the mucous plug before labor begins. This is a small amount of blood-stained mucus. °· Having regular, painful uterine contractions.   °· The time between contractions gets shorter.   °· The discomfort and pain gradually get more intense. °· Contraction pains get worse when walking and do not go away when resting.   °· Your cervix becomes thinner (effacement) and dilates. °BEFORE THE DELIVERY °Once you are in labor and admitted into the hospital or care center, your health care provider may do the following:  °· Perform a complete physical exam. °· Review any complications related to pregnancy or labor.  °· Check your blood pressure, pulse, temperature, and heart rate (vital signs).   °· Determine if, and when, the rupture of amniotic membranes occurred. °· Do a vaginal exam (using a sterile glove and lubricant) to determine:   °¨ The position (presentation) of the baby. Is the baby's head presenting first (vertex) in the birth canal (vagina), or are the feet or buttocks first (breech)?   °¨ The level (station) of the baby's head within the birth canal.   °¨ The effacement and dilatation of the cervix.   °· An electronic fetal monitor is usually placed on your abdomen when you first arrive. This is used to monitor your contractions and the baby's heart rate. °¨ When the monitor is on your abdomen (external fetal monitor), it can only pick up the frequency and  length of your contractions. It cannot tell the strength of your contractions. °¨ If it becomes necessary for your health care provider to know exactly how strong your contractions are or to see exactly what the baby's heart rate is doing, an internal monitor may be inserted into your vagina and uterus. Your health care provider will discuss the benefits and risks of using an internal monitor and obtain your permission before inserting the device. °¨ Continuous fetal monitoring may be needed if you have an epidural, are receiving certain medicines (such as oxytocin), or have pregnancy or labor complications. °· An IV access tube may be placed into a vein in your arm to deliver fluids and medicines if necessary. °THREE STAGES OF LABOR AND DELIVERY °Normal labor and delivery is divided into three stages. °First Stage °This stage starts when you begin to contract regularly and your cervix begins to efface and dilate. It ends when your cervix is completely open (fully dilated). The first stage is the longest stage of labor and can last from 3 hours to 15 hours.  °Several methods are available to help with labor pain. You and your health care provider will decide which option is best for you. Options include:  °· Opioid medicines. These are strong pain medicines that you can get through your IV tube or as a shot into your muscle. These medicines lessen pain but do not make it go away completely.  °· Epidural. A medicine is given through a thin tube that   is inserted in your back. The medicine numbs the lower part of your body and prevents any pain in that area. °· Paracervical pain medicine. This is an injection of an anesthetic on each side of your cervix.   °· You may request natural childbirth, which does not involve the use of pain medicines or an epidural during labor and delivery. Instead, you will use other things, such as breathing exercises, to help cope with the pain. °Second Stage °The second stage of labor  begins when your cervix is fully dilated at 10 cm. It continues until you push your baby down through the birth canal and the baby is born. This stage can take only minutes or several hours. °· The location of your baby's head as it moves through the birth canal is reported as a number called a station. If the baby's head has not started its descent, the station is described as being at minus 3 (-3). When your baby's head is at the zero station, it is at the middle of the birth canal and is engaged in the pelvis. The station of your baby helps indicate the progress of the second stage of labor. °· When your baby is born, your health care provider may hold the baby with his or her head lowered to prevent amniotic fluid, mucus, and blood from getting into the baby's lungs. The baby's mouth and nose may be suctioned with a small bulb syringe to remove any additional fluid. °· Your health care provider may then place the baby on your stomach. It is important to keep the baby from getting cold. To do this, the health care provider will dry the baby off, place the baby directly on your skin (with no blankets between you and the baby), and cover the baby with warm, dry blankets.   °· The umbilical cord is cut. °Third Stage °During the third stage of labor, your health care provider will deliver the placenta (afterbirth) and make sure your bleeding is under control. The delivery of the placenta usually takes about 5 minutes but can take up to 30 minutes. After the placenta is delivered, a medicine may be given either by IV or injection to help contract the uterus and control bleeding. If you are planning to breastfeed, you can try to do so now. °After you deliver the placenta, your uterus should contract and get very firm. If your uterus does not remain firm, your health care provider will massage it. This is important because the contraction of the uterus helps cut off bleeding at the site where the placenta was attached  to your uterus. If your uterus does not contract properly and stay firm, you may continue to bleed heavily. If there is a lot of bleeding, medicines may be given to contract the uterus and stop the bleeding.  °  °This information is not intended to replace advice given to you by your health care provider. Make sure you discuss any questions you have with your health care provider. °  °Document Released: 04/10/2008 Document Revised: 07/23/2014 Document Reviewed: 02/27/2012 °Elsevier Interactive Patient Education ©2016 [employer] ° °

## 2016-01-23 NOTE — Progress Notes (Signed)
Needs ua

## 2016-01-23 NOTE — Progress Notes (Signed)
Subjective:  Haley Greer is a 35 y.o. [id number] at 7971w6d being seen today for ongoing prenatal care.  She is currently monitored for the following issues for this low-risk pregnancy and has Supervision of normal subsequent pregnancy; Rh negative status during pregnancy in second trimester, antepartum; and Toothache on her problem list.  Patient reports no complaints.  Contractions: Irritability. Vag. Bleeding: None.  Movement: Present. Denies leaking of fluid.   The following portions of the patient's history were reviewed and updated as appropriate: allergies, current medications, past family history, past medical history, past social history, past surgical history and problem list. Problem list updated.  Objective:   Filed Vitals:   01/23/16 1007  BP: 107/71  Pulse: 65  Weight: 218 lb 11.2 oz (99.202 kg)    Fetal Status: Fetal Heart Rate (bpm): 156   Movement: Present     General:  Alert, oriented and cooperative. Patient is in no acute distress.  Skin: Skin is warm and dry. No rash noted.   Cardiovascular: Normal heart rate noted  Respiratory: Normal respiratory effort, no problems with respiration noted  Abdomen: Soft, gravid, appropriate for gestational age. Pain/Pressure: Absent     Pelvic:  Cervical exam performed        Extremities: Normal range of motion.  Edema: None  Mental Status: Normal mood and affect. Normal behavior. Normal judgment and thought content.   Urinalysis:      Assessment and Plan:  Pregnancy: [id number] at 9071w6d  1. Encounter for supervision of other normal pregnancy in third trimester [place] confirms cephalic presentation  Term labor symptoms and general obstetric precautions including but not limited to vaginal bleeding, contractions, leaking of fluid and fetal movement were reviewed in detail with the patient. Please refer to After Visit Summary for other counseling recommendations.  Return in about 1 week (around 01/30/2016).   Adam PhenixJames G Lailany Enoch,  MD

## 2016-01-28 ENCOUNTER — Inpatient Hospital Stay (HOSPITAL_COMMUNITY)
Admission: AD | Admit: 2016-01-28 | Discharge: 2016-01-28 | Disposition: A | Discharge status: Home / Self Care | Payer: Medicaid Other | Source: Ambulatory Visit | Attending: Obstetrics & Gynecology | Admitting: Obstetrics & Gynecology

## 2016-01-28 ENCOUNTER — Encounter (HOSPITAL_COMMUNITY): Payer: Self-pay | Admitting: *Deleted

## 2016-01-28 DIAGNOSIS — Z3A39 39 weeks gestation of pregnancy: Secondary | ICD-10-CM | POA: Diagnosis not present

## 2016-01-28 DIAGNOSIS — Z3483 Encounter for supervision of other normal pregnancy, third trimester: Secondary | ICD-10-CM | POA: Insufficient documentation

## 2016-01-28 DIAGNOSIS — O360120 Maternal care for anti-D [Rh] antibodies, second trimester, not applicable or unspecified: Secondary | ICD-10-CM

## 2016-01-28 NOTE — MAU Note (Signed)
Contractions and low back pain since 2030. Denies LOF or bleeding. 2cm last sve

## 2016-01-28 NOTE — Discharge Instructions (Signed)

## 2016-01-30 ENCOUNTER — Encounter: Payer: Medicaid Other | Admitting: Obstetrics & Gynecology

## 2016-02-08 ENCOUNTER — Ambulatory Visit (INDEPENDENT_AMBULATORY_CARE_PROVIDER_SITE_OTHER): Payer: Medicaid Other | Admitting: Obstetrics and Gynecology

## 2016-02-08 ENCOUNTER — Ambulatory Visit (HOSPITAL_COMMUNITY)
Admission: RE | Admit: 2016-02-08 | Discharge: 2016-02-08 | Disposition: A | Discharge status: Home / Self Care | Payer: Medicaid Other | Source: Ambulatory Visit | Attending: Obstetrics and Gynecology | Admitting: Obstetrics and Gynecology

## 2016-02-08 VITALS — BP 111/69 | HR 62 | Wt 214.3 lb

## 2016-02-08 DIAGNOSIS — O99333 Smoking (tobacco) complicating pregnancy, third trimester: Secondary | ICD-10-CM | POA: Diagnosis not present

## 2016-02-08 DIAGNOSIS — O99323 Drug use complicating pregnancy, third trimester: Secondary | ICD-10-CM | POA: Diagnosis not present

## 2016-02-08 DIAGNOSIS — O480 Post-term pregnancy: Secondary | ICD-10-CM

## 2016-02-08 DIAGNOSIS — Z3A41 41 weeks gestation of pregnancy: Secondary | ICD-10-CM | POA: Insufficient documentation

## 2016-02-08 NOTE — Patient Instructions (Signed)
Augmentation of Labor Augmentation of labor is when steps are taken to stimulate and strengthen uterine contractions during labor. This may be done when the contractions have slowed down or stopped, delaying progress of labor and delivery of the baby. Before beginning augmentation of labor, the health care provider will evaluate the condition of the mother and baby, the size and position of the baby, and the size of the birth canal. WHAT ARE THE REASONS FOR LABOR AUGMENTATION? Reasons for augmentation of labor include:   Slow labor (prolonged first and second stage of labor) that has been associated with increased maternal risks, such as chorioamnionitis, postpartum hemorrhage, operative vaginal delivery, or third-degree or fourth-degree perineal lacerations.  Decreased average length of labor. WHAT METHODS ARE USED FOR LABOR AUGMENTATION? Various methods may be used for augmentation of labor, including:   Oxytocin medicine. This medicine stimulates contractions. It is given through an IV access tube inserted into a vein.  Breaking the fluid-filled sac that surrounds the fetus (amniotic sac).  Stripping the membranes. The health care provider separates amniotic sac tissue from the cervix, causing the release of a hormone called progesterone that can stimulate uterine contractions.  Nipple stimulation.  Stimulation of certain pressure points on the ankles.  Manual or mechanical dilation of the cervix. WHAT ARE THE RISKS ASSOCIATED WITH LABOR AUGMENTATION?  Overstimulation of the uterine contractions (continuous, prolonged, very strong contractions), causing fetal distress.   Increased chance of infection for the mother and baby.   Uterine tearing (rupture).   Breaking off (abruption) of the placenta.   Increased chance of cesarean, forceps, or vacuum delivery.  WHAT ARE SOME REASONS FOR NOT DOING LABOR AUGMENTATION? Augmentation of labor should not be done if:  The baby is too  big for the birth canal. This can be confirmed by ultrasonography.   The umbilical cord drops in front of the baby's head or breech part (prolapsed cord).   The mother had a previous cesarean delivery with a vertical incision in the uterus (or the kind of incision used is not known). High dose oxytocin should not be used if the mother had a previous cesarean delivery of any kind.   The mother had previous surgery on or into the uterus.   The mother has herpes.   The mother has cervical cancer.   The baby is lying sideways.   The mother's pelvis is deformed.   The mother is pregnant with more than two babies.    This information is not intended to replace advice given to you by your health care provider. Make sure you discuss any questions you have with your health care provider.   Document Released: 12/25/2006 Document Revised: 07/23/2014 Document Reviewed: 01/29/2013 Elsevier Interactive Patient Education [employer]

## 2016-02-08 NOTE — Progress Notes (Signed)
Subjective:  Haley Greer is a 35 y.o. [id number] at [redacted]w[redacted]d being seen today for ongoing prenatal care.  She is currently monitored for the following issues for this low-risk pregnancy and has Supervision of normal subsequent pregnancy; Rh negative status during pregnancy in second trimester, antepartum; and Toothache on her problem list.  Patient reports no complaints.  Contractions: Not present. Vag. Bleeding: None.  Movement: Present. Denies leaking of fluid.   The following portions of the patient's history were reviewed and updated as appropriate: allergies, current medications, past family history, past medical history, past social history, past surgical history and problem list. Problem list updated.  Objective:   Vitals:   02/08/16 1147  BP: 111/69  Pulse: 62  Weight: 214 lb 4.8 oz (97.2 kg)    Fetal Status: Fetal Heart Rate (bpm): NST   Movement: Present     General:  Alert, oriented and cooperative. Patient is in no acute distress.  Skin: Skin is warm and dry. No rash noted.   Cardiovascular: Normal heart rate noted  Respiratory: Normal respiratory effort, no problems with respiration noted  Abdomen: Soft, gravid, appropriate for gestational age. Pain/Pressure: Present     Pelvic:  Cervical exam deferred        Extremities: Normal range of motion.  Edema: None  Mental Status: Normal mood and affect. Normal behavior. Normal judgment and thought content.   Urinalysis:      Assessment and Plan:  Pregnancy: [id number] at [redacted]w[redacted]d  1. Post term pregnancy over 40 weeks Patient is postterm at this point. She has not been scheduled for induction. There is no induction available until Friday morning. Patient has a reassuring NST today that is reactive and we'll get an AFI. His long as the AFI is normal patient can be sent home to have induction on Friday. - Fetal nonstress test - [place] MFM OB LIMITED; Future  Term labor symptoms and general obstetric precautions including but not  limited to vaginal bleeding, contractions, leaking of fluid and fetal movement were reviewed in detail with the patient. Please refer to After Visit Summary for other counseling recommendations.  No Follow-up on file.   Lorne Skeens, MD

## 2016-02-08 NOTE — Progress Notes (Signed)
Called Haley Greer with IOL appt for Friday 02/10/16 0730 and instructions to come to MAU at any time for signs of labor or distress.

## 2016-02-08 NOTE — Progress Notes (Signed)
NST today, post dates

## 2016-02-08 NOTE — Progress Notes (Signed)
Called L&d to schedule IOL- Dr. Gerarda Gunther will call Charge nurse due to immergent need due to postdates.

## 2016-02-10 ENCOUNTER — Inpatient Hospital Stay (HOSPITAL_COMMUNITY)
Admission: RE | Admit: 2016-02-10 | Discharge: 2016-02-12 | DRG: 775 | Disposition: A | Discharge status: Home / Self Care | Payer: Medicaid Other | Source: Ambulatory Visit | Attending: Obstetrics and Gynecology | Admitting: Obstetrics and Gynecology

## 2016-02-10 ENCOUNTER — Inpatient Hospital Stay (HOSPITAL_COMMUNITY): Payer: Medicaid Other | Admitting: Anesthesiology

## 2016-02-10 ENCOUNTER — Encounter (HOSPITAL_COMMUNITY): Payer: Self-pay

## 2016-02-10 DIAGNOSIS — Z6791 Unspecified blood type, Rh negative: Secondary | ICD-10-CM

## 2016-02-10 DIAGNOSIS — O480 Post-term pregnancy: Secondary | ICD-10-CM | POA: Diagnosis present

## 2016-02-10 DIAGNOSIS — Z87891 Personal history of nicotine dependence: Secondary | ICD-10-CM

## 2016-02-10 DIAGNOSIS — Z8249 Family history of ischemic heart disease and other diseases of the circulatory system: Secondary | ICD-10-CM | POA: Diagnosis not present

## 2016-02-10 DIAGNOSIS — Z3A41 41 weeks gestation of pregnancy: Secondary | ICD-10-CM | POA: Diagnosis not present

## 2016-02-10 DIAGNOSIS — O26893 Other specified pregnancy related conditions, third trimester: Secondary | ICD-10-CM | POA: Diagnosis present

## 2016-02-10 DIAGNOSIS — O360120 Maternal care for anti-D [Rh] antibodies, second trimester, not applicable or unspecified: Secondary | ICD-10-CM

## 2016-02-10 DIAGNOSIS — Z833 Family history of diabetes mellitus: Secondary | ICD-10-CM | POA: Diagnosis not present

## 2016-02-10 DIAGNOSIS — Z3483 Encounter for supervision of other normal pregnancy, third trimester: Secondary | ICD-10-CM

## 2016-02-10 LAB — RAPID URINE DRUG SCREEN, HOSP PERFORMED
AMPHETAMINES: NOT DETECTED
BENZODIAZEPINES: NOT DETECTED
Barbiturates: NOT DETECTED
Cocaine: NOT DETECTED
Opiates: NOT DETECTED
TETRAHYDROCANNABINOL: POSITIVE — AB

## 2016-02-10 LAB — TYPE AND SCREEN
ABO/RH(D): O NEG
Antibody Screen: NEGATIVE

## 2016-02-10 LAB — RPR: RPR: NONREACTIVE

## 2016-02-10 LAB — CBC
HCT: 35.6 % — ABNORMAL LOW (ref 36.0–46.0)
Hemoglobin: 12.1 g/dL (ref 12.0–15.0)
MCH: 29.9 pg (ref 26.0–34.0)
MCHC: 34 g/dL (ref 30.0–36.0)
MCV: 87.9 fL (ref 78.0–100.0)
PLATELETS: 180 10*3/uL (ref 150–400)
RBC: 4.05 MIL/uL (ref 3.87–5.11)
RDW: 13.4 % (ref 11.5–15.5)
WBC: 10.2 10*3/uL (ref 4.0–10.5)

## 2016-02-10 MED ORDER — EPHEDRINE 5 MG/ML INJ
10.0000 mg | INTRAVENOUS | Status: DC | PRN
  Filled 2016-02-10: qty 4

## 2016-02-10 MED ORDER — MISOPROSTOL 25 MCG QUARTER TABLET
25.0000 ug | ORAL_TABLET | ORAL | Status: DC | PRN
  Administered 2016-02-10: 25 ug via VAGINAL
  Filled 2016-02-10: qty 1
  Filled 2016-02-10: qty 0.25

## 2016-02-10 MED ORDER — LACTATED RINGERS IV SOLN
INTRAVENOUS | Status: DC
  Administered 2016-02-10 (×2): via INTRAVENOUS

## 2016-02-10 MED ORDER — PHENYLEPHRINE 40 MCG/ML (10ML) SYRINGE FOR IV PUSH (FOR BLOOD PRESSURE SUPPORT)
80.0000 ug | PREFILLED_SYRINGE | INTRAVENOUS | Status: DC | PRN
  Filled 2016-02-10: qty 5
  Filled 2016-02-10: qty 10

## 2016-02-10 MED ORDER — LACTATED RINGERS IV SOLN
500.0000 mL | Freq: Once | INTRAVENOUS | Status: AC
  Administered 2016-02-10: 500 mL via INTRAVENOUS

## 2016-02-10 MED ORDER — TERBUTALINE SULFATE 1 MG/ML IJ SOLN
0.2500 mg | Freq: Once | INTRAMUSCULAR | Status: DC | PRN
  Filled 2016-02-10: qty 1

## 2016-02-10 MED ORDER — OXYCODONE-ACETAMINOPHEN 5-325 MG PO TABS: 2.0000 | ORAL_TABLET | ORAL | Status: DC | PRN

## 2016-02-10 MED ORDER — OXYTOCIN 40 UNITS IN LACTATED RINGERS INFUSION - SIMPLE MED
1.0000 m[IU]/min | INTRAVENOUS | Status: DC
  Administered 2016-02-10: 2 m[IU]/min via INTRAVENOUS
  Filled 2016-02-10: qty 1000

## 2016-02-10 MED ORDER — OXYTOCIN BOLUS FROM INFUSION
500.0000 mL | Freq: Once | INTRAVENOUS | Status: AC
  Administered 2016-02-10: 500 mL via INTRAVENOUS

## 2016-02-10 MED ORDER — DIPHENHYDRAMINE HCL 50 MG/ML IJ SOLN: 12.5000 mg | INTRAMUSCULAR | Status: DC | PRN

## 2016-02-10 MED ORDER — ONDANSETRON HCL 4 MG/2ML IJ SOLN: 4.0000 mg | Freq: Four times a day (QID) | INTRAMUSCULAR | Status: DC | PRN

## 2016-02-10 MED ORDER — ZOLPIDEM TARTRATE 5 MG PO TABS: 5.0000 mg | ORAL_TABLET | Freq: Every evening | ORAL | Status: DC | PRN

## 2016-02-10 MED ORDER — FENTANYL 2.5 MCG/ML BUPIVACAINE 1/10 % EPIDURAL INFUSION (WH - ANES)
14.0000 mL/h | INTRAMUSCULAR | Status: DC | PRN
  Administered 2016-02-10 (×2): 14 mL/h via EPIDURAL
  Filled 2016-02-10 (×2): qty 125

## 2016-02-10 MED ORDER — FLEET ENEMA 7-19 GM/118ML RE ENEM: 1.0000 | ENEMA | RECTAL | Status: DC | PRN

## 2016-02-10 MED ORDER — ACETAMINOPHEN 325 MG PO TABS
650.0000 mg | ORAL_TABLET | ORAL | Status: DC | PRN
Start: 2016-02-10 — End: 2016-02-11

## 2016-02-10 MED ORDER — LACTATED RINGERS IV SOLN: 500.0000 mL | INTRAVENOUS | Status: DC | PRN

## 2016-02-10 MED ORDER — LIDOCAINE HCL (PF) 1 % IJ SOLN
INTRAMUSCULAR | Status: DC | PRN
  Administered 2016-02-10: 6 mL via EPIDURAL
  Administered 2016-02-10: 4 mL

## 2016-02-10 MED ORDER — FENTANYL CITRATE (PF) 100 MCG/2ML IJ SOLN: 50.0000 ug | INTRAMUSCULAR | Status: DC | PRN

## 2016-02-10 MED ORDER — PHENYLEPHRINE 40 MCG/ML (10ML) SYRINGE FOR IV PUSH (FOR BLOOD PRESSURE SUPPORT)
80.0000 ug | PREFILLED_SYRINGE | INTRAVENOUS | Status: DC | PRN
  Filled 2016-02-10: qty 5

## 2016-02-10 MED ORDER — FENTANYL CITRATE (PF) 100 MCG/2ML IJ SOLN
100.0000 ug | INTRAMUSCULAR | Status: DC | PRN
Start: 2016-02-10 — End: 2016-02-11
  Administered 2016-02-10 (×2): 100 ug via INTRAVENOUS
  Filled 2016-02-10 (×2): qty 2

## 2016-02-10 MED ORDER — OXYTOCIN 40 UNITS IN LACTATED RINGERS INFUSION - SIMPLE MED: 2.5000 [IU]/h | INTRAVENOUS | Status: DC

## 2016-02-10 MED ORDER — LIDOCAINE HCL (PF) 1 % IJ SOLN
30.0000 mL | INTRAMUSCULAR | Status: DC | PRN
  Filled 2016-02-10: qty 30

## 2016-02-10 MED ORDER — SOD CITRATE-CITRIC ACID 500-334 MG/5ML PO SOLN
30.0000 mL | ORAL | Status: DC | PRN
  Administered 2016-02-10: 30 mL via ORAL
  Filled 2016-02-10: qty 15

## 2016-02-10 MED ORDER — OXYCODONE-ACETAMINOPHEN 5-325 MG PO TABS: 1.0000 | ORAL_TABLET | ORAL | Status: DC | PRN

## 2016-02-10 NOTE — Anesthesia Procedure Notes (Signed)
Epidural Patient location during procedure: OB  Staffing Anesthesiologist: Najat Olazabal Performed: anesthesiologist   Preanesthetic Checklist Completed: patient identified, site marked, surgical consent, pre-op evaluation, timeout performed, IV checked, risks and benefits discussed and monitors and equipment checked  Epidural Patient position: sitting Prep: site prepped and draped and DuraPrep Patient monitoring: continuous pulse ox and blood pressure Approach: midline Location: L3-L4 Injection technique: LOR saline  Needle:  Needle type: Tuohy  Needle gauge: 17 G Needle length: 9 cm and 9 Needle insertion depth: 6 cm Catheter type: closed end flexible Catheter size: 19 Gauge Catheter at skin depth: 10 cm Test dose: negative  Assessment Events: blood not aspirated, injection not painful, no injection resistance, negative IV test and no paresthesia  Additional Notes Patient identified. Risks/Benefits/Options discussed with patient including but not limited to bleeding, infection, nerve damage, paralysis, failed block, incomplete pain control, headache, blood pressure changes, nausea, vomiting, reactions to medication both or allergic, itching and postpartum back pain. Confirmed with bedside nurse the patient's most recent platelet count. Confirmed with patient that they are not currently taking any anticoagulation, have any bleeding history or any family history of bleeding disorders. Patient expressed understanding and wished to proceed. All questions were answered. Sterile technique was used throughout the entire procedure. Please see nursing notes for vital signs. Test dose was given through epidural catheter and negative prior to continuing to dose epidural or start infusion. Warning signs of high block given to the patient including shortness of breath, tingling/numbness in hands, complete motor block, or any concerning symptoms with instructions to call for help. Patient was given  instructions on fall risk and not to get out of bed. All questions and concerns addressed with instructions to call with any issues or inadequate analgesia.        

## 2016-02-10 NOTE — Anesthesia Pain Management Evaluation Note (Signed)
  CRNA Pain Management Visit Note  Patient: Haley Greer, 35 y.o., female  "Hello I am a member of the anesthesia team at [facility]. We have an anesthesia team available at all times to provide care throughout the hospital, including epidural management and anesthesia for C-section. I don't know your plan for the delivery whether it a natural birth, water birth, IV sedation, nitrous supplementation, doula or epidural, but we want to meet your pain goals."   1.Was your pain managed to your expectations on prior hospitalizations?   Yes   2.What is your expectation for pain management during this hospitalization?     Epidural and IV pain meds  3.How can we help you reach that goal? Iv pain meds then epidural if pain meds is not enough  Record the patient's initial score and the patient's pain goal.   Pain: 4  Pain Goal: 4 [facility] wants you to be able to say your pain was always managed very well.  Madison Hickman 02/10/2016

## 2016-02-10 NOTE — Progress Notes (Signed)
Patient ID: Haley Greer, female   DOB: 10-26-1980, 35 y.o.   MRN: [id number]  Comfortable w/ epidural; difficult to trace ctx VSS, afeb FHR 130s, +accels, early variables Ctx q 2-3 mins Cx 6/80/0; IUPC inserted without difficulty; Pit @ 27mu/min  IUP@term  Active labor- just starting  Anticipate SVD  Cam Hai CNM 02/10/2016 9:09 PM

## 2016-02-10 NOTE — Progress Notes (Signed)
Patient ID: NAKAYA SEABRIGHT, female   DOB: 13-Dec-1980, 35 y.o.   MRN: [id number]  Feeling some cramping; s/p cytotec x 1 VSS, afeb FHR 135-145, +accels, no decels +SS Ctx irreg Cx post 3/50/-2; AROM for scant pink fluid  IUP@term  Favorable cx IOL process  Will start Pitocin  Cam Hai 02/10/2016 12:38 PM

## 2016-02-10 NOTE — H&P (Signed)
LABOR AND DELIVERY ADMISSION HISTORY AND PHYSICAL NOTE  Haley Greer is a 35 y.o. female [phone] with IUP at [redacted]w[redacted]d by 13 week [place] presenting for PD IOL.   She reports positive fetal movement. She denies leakage of fluid, vaginal bleeding, or discharge.   Prenatal History/Complications:  Past Medical History: Past Medical History:  Diagnosis Date  . Migraine     Past Surgical History: Past Surgical History:  Procedure Laterality Date  . CHOLECYSTECTOMY      Obstetrical History: OB History    Gravida Para Term Preterm AB Living   SAB TAB Ectopic Multiple Live Births   2     0 3      Social History: Social History   Social History  . Marital status: Married    Spouse name: N/A  . Number of children: N/A  . Years of education: N/A   Social History Main Topics  . Smoking status: Former Smoker    Packs/day: 0.50    Types: Cigarettes    Quit date: 12/22/2015  . Smokeless tobacco: Never Used  . 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 Drug use: No     Comment: quit using marijuana with pregnancy  . Sexual activity: Not Currently    Birth control/ protection: None   Other Topics Concern  . None   Social History Narrative  . None    Family History: Family History  Problem Relation Age of Onset  . Diabetes Mother   . Heart disease Father     Allergies: Allergies  Allergen Reactions  . Chloraprep One Step [Chlorhexidine Gluconate] Rash    No prescriptions prior to admission.    Review of Systems   All systems reviewed and negative except as stated in HPI  Blood pressure 117/77, pulse 79, temperature 98.1 F (36.7 C), temperature source Oral, resp. rate 18, height  (1.803 m), weight 214 lb (97.1 kg), last menstrual period 04/26/2015, unknown if currently breastfeeding.   General appearance: alert, cooperative and no distress Lungs: clear to auscultation bilaterally Heart: regular rate and rhythm Abdomen: soft,  non-tender Extremities: No calf swelling or tenderness Presentation: cephalic  Fetal monitoring: Fetal Heart Rate A  Mode External filed at 02/10/2016 0730  Baseline Rate (A) 130 bpm filed at 02/10/2016 1000  Variability 6-25 BPM filed at 02/10/2016 1000  Accelerations 15 x 15 filed at 02/10/2016 1000  Decelerations None filed at 02/10/2016 1000   Uterine activity: Mild, irregular Dilation: 2 Effacement (%): 50 Station: Ballotable Exam by:: Davis,RN   Prenatal labs: ABO, Rh: --/--/O NEG (07/28 0805) Antibody: NEG (07/28 0805) Rubella: immune  RPR: NON REAC (04/26 1152)  HBsAg: NEGATIVE (12/22 1422)  HIV: NONREACTIVE (04/26 1152)  GBS:   negative 1 hr Glucola: 94 Genetic screening:  Negative first trimester screen Anatomy US: wnl, boy  Prenatal Transfer Tool  Maternal Diabetes: No Genetic Screening: Normal Maternal Ultrasounds/Referrals: Normal Fetal Ultrasounds or other Referrals:  Referred to Materal Fetal Medicine  Maternal Substance Abuse:  No Significant Maternal Medications:  NA  Significant Maternal Lab Results: Lab values include: Group B Strep negative  Results for orders placed or performed during the hospital encounter of 02/10/16 (from the past 24 hour(s))  CBC   Collection Time: 02/10/16  8:05 AM  Result Value Ref Range   WBC 10.2 4.0 - 10.5 K/uL   RBC 4.05 3.87 - 5.11 MIL/uL   Hemoglobin  12.1 12.0 - 15.0 g/dL   HCT 56.9 (L) 79.4 - 80.1 %   MCV 87.9 78.0 - 100.0 fL   MCH 29.9 26.0 - 34.0 pg   MCHC 34.0 30.0 - 36.0 g/dL   RDW 65.5 37.4 - 82.7 %   Platelets 180 150 - 400 K/uL  Type and screen [facility]   Collection Time: 02/10/16  8:05 AM  Result Value Ref Range   ABO/RH(D) O NEG    Antibody Screen NEG    Sample Expiration 02/13/2016     Patient Active Problem List   Diagnosis Date Noted  . Post term pregnancy 02/10/2016  . Toothache 08/23/2015  . Supervision of normal subsequent pregnancy 07/26/2015  . Rh negative  status during pregnancy in second trimester, antepartum 07/26/2015    Assessment: Haley Greer is a 35 y.o. [id number] at [redacted]w[redacted]d here for PD IOL  #Labor: received first dose of cyotec 0830 #Pain: Fentanyl, planning epidural #FWB: Cat 1 #ID:  GBS negative  #MOF: breast #MOC:undecided #Circ:  Outpatient   Andres Ege, MD, PGY-1, MPH 02/10/2016, 10:10 AM   CNM attestation:  I have seen and examined this patient; I agree with above documentation in the resident's note.   Haley Greer is a 35 y.o. [phone] here for IOL due to post term  PE: BP 117/65   Pulse 71   Temp 97.7 F (36.5 C) (Oral)   Resp 18   Ht 5\' 11"  (1.803 m)   Wt 97.1 kg (214 lb)   LMP 04/26/2015   BMI 29.85 kg/m  Gen: calm comfortable, NAD Resp: normal effort, no distress Abd: gravid  ROS, labs, PMH reviewed  Plan: Admit to [employer] Cytotec given; plan on Pit/AROM Anticipate SVD  Cam Hai CNM 02/10/2016, 1100a

## 2016-02-10 NOTE — Anesthesia Preprocedure Evaluation (Signed)

## 2016-02-10 NOTE — Anesthesia Procedure Notes (Signed)

## 2016-02-11 ENCOUNTER — Encounter (HOSPITAL_COMMUNITY): Payer: Self-pay

## 2016-02-11 MED ORDER — WITCH HAZEL-GLYCERIN EX PADS: 1.0000 "application " | MEDICATED_PAD | CUTANEOUS | Status: DC | PRN

## 2016-02-11 MED ORDER — OXYCODONE-ACETAMINOPHEN 5-325 MG PO TABS: 1.0000 | ORAL_TABLET | ORAL | Status: DC | PRN

## 2016-02-11 MED ORDER — BENZOCAINE-MENTHOL 20-0.5 % EX AERO
1.0000 "application " | INHALATION_SPRAY | CUTANEOUS | Status: DC | PRN
  Administered 2016-02-11: 1 via TOPICAL
  Filled 2016-02-11: qty 56

## 2016-02-11 MED ORDER — COCONUT OIL OIL: 1.0000 "application " | TOPICAL_OIL | Status: DC | PRN

## 2016-02-11 MED ORDER — SENNOSIDES-DOCUSATE SODIUM 8.6-50 MG PO TABS
2.0000 | ORAL_TABLET | ORAL | Status: DC
  Filled 2016-02-11: qty 2

## 2016-02-11 MED ORDER — ONDANSETRON HCL 4 MG/2ML IJ SOLN: 4.0000 mg | INTRAMUSCULAR | Status: DC | PRN

## 2016-02-11 MED ORDER — OXYCODONE-ACETAMINOPHEN 5-325 MG PO TABS
2.0000 | ORAL_TABLET | ORAL | Status: DC | PRN
  Administered 2016-02-11 – 2016-02-12 (×8): 2 via ORAL
  Filled 2016-02-11 (×9): qty 2

## 2016-02-11 MED ORDER — OXYCODONE-ACETAMINOPHEN 5-325 MG PO TABS
1.0000 | ORAL_TABLET | ORAL | Status: DC
  Administered 2016-02-11 (×2): 1 via ORAL

## 2016-02-11 MED ORDER — DIBUCAINE 1 % RE OINT: 1.0000 "application " | TOPICAL_OINTMENT | RECTAL | Status: DC | PRN

## 2016-02-11 MED ORDER — IBUPROFEN 600 MG PO TABS
600.0000 mg | ORAL_TABLET | Freq: Four times a day (QID) | ORAL | Status: DC
  Administered 2016-02-11 (×3): 600 mg via ORAL
  Filled 2016-02-11 (×5): qty 1

## 2016-02-11 MED ORDER — ACETAMINOPHEN 325 MG PO TABS
650.0000 mg | ORAL_TABLET | ORAL | Status: DC | PRN
  Administered 2016-02-11: 650 mg via ORAL
  Filled 2016-02-11: qty 2

## 2016-02-11 MED ORDER — DIPHENHYDRAMINE HCL 25 MG PO CAPS: 25.0000 mg | ORAL_CAPSULE | Freq: Four times a day (QID) | ORAL | Status: DC | PRN

## 2016-02-11 MED ORDER — ONDANSETRON HCL 4 MG PO TABS: 4.0000 mg | ORAL_TABLET | ORAL | Status: DC | PRN

## 2016-02-11 MED ORDER — TETANUS-DIPHTH-ACELL PERTUSSIS 5-2.5-18.5 LF-MCG/0.5 IM SUSP: 0.5000 mL | Freq: Once | INTRAMUSCULAR | Status: DC

## 2016-02-11 MED ORDER — ZOLPIDEM TARTRATE 5 MG PO TABS: 5.0000 mg | ORAL_TABLET | Freq: Every evening | ORAL | Status: DC | PRN

## 2016-02-11 MED ORDER — CALCIUM CARBONATE ANTACID 500 MG PO CHEW
1.0000 | CHEWABLE_TABLET | ORAL | Status: DC | PRN
  Administered 2016-02-11: 200 mg via ORAL
  Filled 2016-02-11: qty 1

## 2016-02-11 MED ORDER — SIMETHICONE 80 MG PO CHEW: 80.0000 mg | CHEWABLE_TABLET | ORAL | Status: DC | PRN

## 2016-02-11 MED ORDER — PRENATAL MULTIVITAMIN CH
1.0000 | ORAL_TABLET | Freq: Every day | ORAL | Status: DC
  Administered 2016-02-11: 1 via ORAL
  Filled 2016-02-11 (×2): qty 1

## 2016-02-11 NOTE — Progress Notes (Signed)
Pt called out for pain medication. New order stated one Percocet every 4 hours. When attempting to give one Percocet, pt raised her voice and began slamming things on the bed. Pt stated " I did not give her any pain medication today and that she wanted to talk to the blonde doctor who had the glasses on the top of her head". Pt also stated that " I am ready for shift change." I called Dr Lavell Islam and clarified pt 's order for Percocet. No new orders given. Will continue to monitor pt.

## 2016-02-11 NOTE — Anesthesia Postprocedure Evaluation (Signed)
Anesthesia Post Note  Patient: Haley Greer  Procedure(s) Performed: * No procedures listed *  Patient location during evaluation: Mother Baby Anesthesia Type: Epidural Level of consciousness: awake and alert, oriented and patient cooperative Pain management: pain level controlled Vital Signs Assessment: post-procedure vital signs reviewed and stable Respiratory status: spontaneous breathing Cardiovascular status: stable Postop Assessment: no headache, epidural receding, patient able to bend at knees and no signs of nausea or vomiting Anesthetic complications: no Comments: Pain score 7.  Pt on percocet d/t motrin/tylenol ineffective for pain relief.  Next dose of pain med due at conclusion of interview.     Last Vitals:  Vitals:   02/11/16 0148 02/11/16 0530  BP: 116/72 114/63  Pulse: 78 80  Resp: 16 16  Temp: 36.6 C 36.8 C    Last Pain:  Vitals:   02/11/16 0530  TempSrc: Oral  PainSc: 3    Pain Goal: Patients Stated Pain Goal: 3 (02/11/16 0530)               Merrilyn Puma

## 2016-02-11 NOTE — Lactation Note (Signed)
This note was copied from a baby's chart. Lactation Consultation Note  Patient Name: Haley Greer GSUPJ'S Date: 02/11/2016 Reason for consult: Follow-up assessment Baby at 23 hr of life and RN is concerned because she has not observed a feeding. Offered to help mom with latch or pumping and she declined. Reviewed manual expression and spoon feeding. Mom stated that her sore nipples are feeling better since she took a shower. She thinks the baby was not getting a deep latch because she smelled bad. Given inverted nipple shells. She stated that her other children caused her nipples to bleed. Suggested some positions to try. Mom was pleasant but would not allow a breast assessment, feeding assessment, or an oral assessment on the baby. Asked her to call at the next feeding for lactation or the RN and she stated that she thinks she can bf the baby on her own. She is aware of OP lactation services and support group. She will call as needed.   Maternal Data    Feeding Feeding Type:  (enc mom to call when brfeds) Length of feed: 8 min (per mom)  LATCH Score/Interventions Latch:  (pt refused to let me help her latch)                    Lactation Tools Discussed/Used Tools: Pump Breast pump type: Double-Electric Breast Pump (per mom's request)   Consult Status Consult Status: Follow-up Date: 02/12/16 Follow-up type: In-patient    Rulon Eisenmenger 02/11/2016, 10:03 PM

## 2016-02-11 NOTE — Lactation Note (Signed)
This note was copied from a baby's chart. Lactation Consultation Note  Patient Name: Haley Greer OBSJG'G Date: 02/11/2016 Reason for consult: Initial assessment (mom aware to call with feeding cues for feeding assessment ) Baby is 13 hours old and has been to the breast for attempts and one good feeding , voids 2 and stools 3 in life. Per mom last attempted at 11 am , no latch , and skin to skin for 20 mins.  LC offered to show mom how to hand express, and she declined at this consult.  LC recommended to mom to call on the nurses light when abby is showing cues so LC can watch a feeding.  LC explained LATCH scores and the importance for evaluation how the baby is doing with feedings.  Mother informed of post-discharge support and given phone number to the lactation department, including services for phone call assistance; out-patient appointments; and breastfeeding support group. List of other breastfeeding resources n the community given in the handout. Encouraged mother to call for problems or concerns related to breastfeeding.  Maternal Data Has patient been taught Hand Expression?: No (mom declined at ths consult ) Does the patient have breastfeeding experience prior to this delivery?: Yes  Feeding Feeding Type: Breast Fed (per  mom )  LATCH Score/Interventions                      Lactation Tools Discussed/Used WIC Program: No (per mom , has resource sheet to sign up )   Consult Status Consult Status: Follow-up Date: 02/11/16 Follow-up type: In-patient    [name] 02/11/2016, 12:01 PM

## 2016-02-11 NOTE — Progress Notes (Signed)
Pt stated it had been a while since baby had eaten. Baby was showing feeding cues on assessment. Mother stated she was about to feed baby. I informed her that I needed to give the baby a latch score. She asked what that was and I explained that I would watch the baby latch to see how well he does. She stated " No, you will not do that, I need my privacy". I then offered her the scheduled Ibuprofen which she declined, stating "that doesn't work".

## 2016-02-11 NOTE — Clinical Social Work Maternal (Signed)
CLINICAL SOCIAL WORK MATERNAL/CHILD NOTE  Patient Details  Name: Haley Greer MRN: [id number] Date of Birth: 21-Oct-1980  Date:  02/11/2016  Clinical Social Worker Initiating Note:  Laurey Arrow Date/ Time Initiated:  02/11/16/1458     Child's Name:  Haley Greer   Legal Guardian:  Mother   Need for Interpreter:  None   Date of Referral:  02/11/16     Reason for Referral:  Current Substance Use/Substance Use During Pregnancy    Referral Source:      Address:     Phone number:      Household Members:      Natural Supports (not living in the home):      Professional Supports:     Employment:     Type of Work:     Education:  9 to 11 years   Museum/gallery curator Resources:  [employer]   Other Resources:   Mudlogger is pending)   Cultural/Religious Considerations Which May Impact Care:  Per [employer], MOB is Engineer, manufacturing  Strengths:  Ability to meet basic needs , Home prepared for child    Risk Factors/Current Problems:  Substance Use    Cognitive State:  Flight of Ideas , Alert    Mood/Affect:  Calm , Comfortable , Interested , Relaxed    CSW Assessment: CSW met with MOB to complete an assessment for a consult for hx of THC, and "drug exposed baby".  MOB was inviting and engaged during the visit. When CSW arrived, MOB gave CSW permission to excuse family from the room to meet with MOB in private.  MOB gave CSW permission to allow FOB/Husband (Zachery Sprinkle) to remain in the room while CSW met with MOB. FOB was appropriate, attentive, and engaged with the baby during the session. CSW inquired about MOB supports and living situation.  MOB communicated that she and FOB and MOB's 2 older children resides with MOB.  MOB also reported that MOB's 3 child is in the child's paternal grandmother custody. MOB communicated she has very little support from MOB's family because they all live in [place], however, she has a wealth of support from FOB's  family. MOB denied any CPS history, and expressed MOB and FOB has what they need to care for infant after discharge.  CSW informed MOB of the hospital's drug screen policy, and informed MOB and FOB of the 2 screenings for the infant. MOB appeared shocked and reported to CSW that MOB has not utilized any marijuana since she found out she was pregnant.  CSW informed MOB of infant's positive UDS for THC, and MOB continue to deny any substance use, however, MOB was understanding of the hospital's policy. MOB declined any resources for substance abuse. CSW informed MOB that CSW would make a report to [place] for infant's positive UDS for THC.  MOB and FOB had no response and did not ask any additional questions.  CSW educated MOB and FOB about PPD.  CSW informed MOB and FOB of possible supports and interventions to decrease PPD.  CSW also encouraged MOB to seek medical attention if needed for increased signs and symptoms for PPD.  CSW also reviewed safe sleep, and SIDS. MOB and FOB were knowledgeable.  MOB communicated that she has a bassinet for the baby and is aware of safe sleep interventions. CSW thanked MOB for her willingness to meet with her.  MOB did not have any further questions, concerns, or needs at this time.   CSW observed  MOB having a flight of ideas during assessment.  MOB appeared anxious and fidgety.   CSW made report made a CPS report to [facility] Jerelene Redden [phone]). No further interventions are required and no barriers to discharge.   CSW Plan/Description:  Information/Referral to [employer] , Psychosocial Support and Ongoing Assessment of Needs, Patient/Family Education , No Further Intervention Required/No Barriers to Discharge, Child Protective Service Report     Dimple Nanas, LCSW 02/11/2016, 3:01 PM

## 2016-02-11 NOTE — Progress Notes (Signed)
Patient ID: Haley Greer, female   DOB: 05-Dec-1980, 35 y.o.   MRN: [id number]  POSTPARTUM PROGRESS NOTE  Post Partum Day #1 Subjective:  Haley Greer is a 35 y.o. Q6S3419 [redacted]w[redacted]d s/p SVD.  No acute events overnight.  Pt denies problems with ambulating, voiding or po intake.  She denies nausea or vomiting.  Pain is poorly controlled.  She has had flatus. She has not had bowel movement.  Lochia Minimal, denies odor. Patient states her pain has not been controlled with scheduled Motrin or Tylenol and the Percocets (2 tabs) are not working.   Objective: Blood pressure 114/63, pulse 80, temperature 98.2 F (36.8 C), temperature source Oral, resp. rate 16, height 5\' 11"  (1.803 m), weight 214 lb (97.1 kg), last menstrual period 04/26/2015, SpO2 100 %, unknown if currently breastfeeding.  Physical Exam:  General: alert and no distress. Afebrile. Angry and upset. Lochia:normal flow Chest: CTAB Heart: RRR no m/r/g Abdomen: +BS, soft, mildly tender on uterus palpation,  Uterine Fundus: firm, below umbilicus DVT Evaluation: No calf swelling or tenderness Extremities: Trace edema   Recent Labs  02/10/16 0805  HGB 12.1  HCT 35.6*    Assessment/Plan:  ASSESSMENT: Haley Greer is a 35 y.o. [id number] [redacted]w[redacted]d s/p SVD.   Pain control: scheduled Percocet q4h, with Motrin scheduled q6h.  Encouraged ambulation. Plan for discharge tomorrow, Breastfeeding and Contraception unsure   LOS: 1 day   Jen Mow, DO 02/11/2016, 1:04 PM

## 2016-02-11 NOTE — Progress Notes (Signed)
Patient ID: Haley Greer, female   DOB: 02-21-1981, 35 y.o.   MRN: [id number] Post Partum Day 1 Subjective: No acute events since delivery last night. Does have 7/10 pain in lower abdomen. Has received motrin and percocet. Due for another dose now. Minimal lochia. Did pass one small clot. No issues voiding. Has not tried to eat yet. MOF: br MOC undecided.   Objective: Blood pressure 114/63, pulse 80, temperature 98.2 F (36.8 C), temperature source Oral, resp. rate 16, height 5\' 11"  (1.803 m), weight 97.1 kg (214 lb), last menstrual period 04/26/2015, SpO2 100 %, unknown if currently breastfeeding.  Physical Exam:  General: alert, cooperative and no distress Lochia: appropriate Uterine Fundus: firm DVT Evaluation: No evidence of DVT seen on physical exam.   Recent Labs  02/10/16 0805  HGB 12.1  HCT 35.6*    Assessment/Plan:  Haley Greer is a 35yo [id number] PPD#1 SVD with no complications. Continue to monitor pain. Plan for discharge tomorrow   LOS: 1 day   Maryelizabeth Kaufmann 02/11/2016, 8:00 AM    OB FELLOW MEDICAL STUDENT NOTE ATTESTATION  I have seen and examined this patient. Note this is a Psychologist, occupational note and as such does not necessarily reflect the patient's plan of care. Please see progress note for this date of service.    Jen Mow, DO 02/12/2016, 4:21 AM

## 2016-02-12 MED ORDER — IBUPROFEN 600 MG PO TABS: 600.0000 mg | ORAL_TABLET | Freq: Four times a day (QID) | ORAL | 0 refills | Status: DC

## 2016-02-12 NOTE — Progress Notes (Signed)
CSW called by unit re:mom's anger/outburst when she learned that her baby would not be d/c today.  Discussed pt's mood/behavior si with Peds MD, RN and paternal grandmother.Per paternal grandmother, pt is not at her emotional baseline, and that this behavior is not typical for her.  Paternal grandmother confirmed that pt/FOB live alone in an apartment connected to her father's home in [place].  Per PGM, pt does not have custody of any of her three other children.   CSW has updated Marla Roe of [facility]  CPS re: pt's recent behaviors and questions re: custody of her other children.  Based on new information, CPS will f/u with pt on 7/31, as opposed to 8/1.  Intake Supervisor, Aura Camps ([phone]) will assign case to a CPS Worker in the am.  Unit CSW to follow.  Pollyann Savoy, [place] [phone] Weekend Coverge

## 2016-02-12 NOTE — Progress Notes (Addendum)
Went in patient's room to offer pt her scheduled motrin and prenatal vitamin and upon entering pt was crying.  When asked pt is everything was okay she stated, "Everyone keeps coming in here and pissing me off.  That is causing my pain medicine not to work and it is making me bleed more. Lawanna Kobus the Child psychotherapist is telling lies about me and we just want to go home with our baby."  FOB was soothing patient while she was speaking with me and started to cry also.  He stated that, "I have been here the whole time and she hasn't flipped anyone off, or thrown anything at anyone."  I listened to patient's concerns and offered her medicine to which she declined.  I asked her if there was anything I could do for her at this time and she declined.  Stated I would be back in around 1400 to check on them and get a weight on the baby, which I would do in the room, and if she needed me before then to call for me.  Patient voiced understanding and was not crying when I left the room.

## 2016-02-12 NOTE — Progress Notes (Signed)
Rn called Dr.Mumaw  regarding patient's pain level. Patient is taking  perocet x 2 every four hours and exceeded 24 hour 2400 mg dose at 0111am.. Dr. Omer Jack  will see patient during rounds.

## 2016-02-12 NOTE — Progress Notes (Signed)
Called by OB re: pt's concerning behaviors.  CSW met with pt/FOB to discuss concerns from medical staff re: pt's mood, her reluctance to allow MD to examine her, to demonstrate baby's latch, to put baby down/give to RN, etc. Also discussed concerns re: "breakdown" during night, interactions with previous RN, and her throwing stuff at FOB.  Pt/FOBcalm, cooperative with CSW and willing to discuss concerns of medical staff.  Essentially, mom believes that her mood is the result of being sleep deprived, not having slept in 2 days.  She admits that she may have had a personality conflict with her night RN and feels as if her OB "kept picking" at her today and was not giving her pain meds, even though she was in pain.  Pt maintains that she was not going to let MD examine her because it would only add to her pain/discomfort.  Pt had received 2 Percocets prior to [place] visit, but plans to "cut down" her pain meds following this dose.  She expressed her satisfaction with  her RN/NT today, and confirms that her RN held the baby today.    Mom acknowledges that her frustration peaked during the early am but states that this was due to her dissatisfaction with her RN and constant interruptions, when she just wanted to relax and care for her baby.  FOB/pt both agree that breast feeding is coming along well, and the baby is feeding longer each time he feeds.  Mom/FOB reminded of the importance of cooperating with the medical team/POC of both her/baby and stressed that staff is here to assist her, not to make her feel inferior, nor insinuate that she is a "bad mom."  Pt indicated that she understood and is agreeable to cooperate with the medical team.  Emotional support provided.  CSW will continue to follow for disposition.  Creta Levin, LCSW Weekend Coverage [phone]

## 2016-02-12 NOTE — Progress Notes (Signed)
Patient ID: SHYAN BENWAY, female   DOB: 03-Nov-1980, 35 y.o.   MRN: [id number]  POSTPARTUM PROGRESS NOTE  Post Partum Day #2 Subjective:  SHARMON HUNNEWELL is a 35 y.o. A4S9753 [redacted]w[redacted]d s/p NSVD.  No acute events overnight.  Pt denies problems with ambulating, voiding or po intake.  She denies nausea or vomiting.  Pain is poorly controlled.  She has had flatus. She has had bowel movement.  Lochia Minimal.   Objective: Blood pressure 118/76, pulse 67, temperature 97.8 F (36.6 C), temperature source Oral, resp. rate 18, height 5\' 11"  (1.803 m), weight 214 lb (97.1 kg), last menstrual period 04/26/2015, SpO2 100 %, unknown if currently breastfeeding.  Physical Exam:  Could not perform, patient refused to allow myself to examine her or touch her. Patient was angry and frustrated, not cooperative. She had tangential conversations. Easily angered.    Recent Labs  02/10/16 0805  HGB 12.1  HCT 35.6*    Assessment/Plan:  ASSESSMENT: CAMYA CERUTTI is a 35 y.o. [id number] [redacted]w[redacted]d s/p NSVD  Spoke with social worker, Augusto Gamble, who will come by the room and speak with the patient to allow MD to examine patient later. Concerned about the patient's mental well-being, sleep deprivation, and pain control. Will reevaluate later this AM. Possible discharge today or tomorrow.   LOS: 2 days   Jen Mow, DO 02/12/2016, 9:59 AM

## 2016-02-12 NOTE — Discharge Summary (Signed)
OB Discharge Summary  Patient Name: Haley Greer DOB: Aug 12, 1980 MRN: [id number]  Date of admission: 02/10/2016 Delivering MD: Cam Hai D   Date of discharge: 02/12/2016  Admitting diagnosis: INDUCTION Intrauterine pregnancy: [redacted]w[redacted]d     Secondary diagnosis:Active Problems:   Post term pregnancy  Additional problems:none     Discharge diagnosis: Term Pregnancy Delivered                                                                     Post partum procedures:none  Augmentation: Pitocin  Complications: None  Hospital course:  Onset of Labor With Vaginal Delivery     35 y.o. yo [id number] at [redacted]w[redacted]d was admitted in Latent Labor on 02/10/2016. Patient had an uncomplicated labor course as follows:  Membrane Rupture Time/Date: 12:31 PM ,02/10/2016   Intrapartum Procedures: Episiotomy: None [1]                                         Lacerations:  None [1]  Patient had a delivery of a Viable infant. 02/10/2016  Information for the patient's newborn:  Wilmina, Brabender [[id number]]  Delivery Method: Vaginal, Spontaneous Delivery (Filed from Delivery Summary)    Pateint had an uncomplicated postpartum course.  She is ambulating, tolerating a regular diet, passing flatus, and urinating well. Patient is discharged home in stable condition on 02/12/16.    Physical exam Vitals:   02/11/16 0148 02/11/16 0530 02/11/16 1900 02/12/16 0614  BP: 116/72 114/63 114/73 118/76  Pulse: 78 80 72 67  Resp: 16 16 18 18   Temp: 97.9 F (36.6 C) 98.2 F (36.8 C) 98.3 F (36.8 C) 97.8 F (36.6 C)  TempSrc: Oral Oral Oral Oral  SpO2: 100%  100%   Weight:      Height:       General: alert, cooperative and no distress Lochia: appropriate Uterine Fundus: firm Incision: N/A DVT Evaluation: No evidence of DVT seen on physical exam. Negative Homan's sign. Labs: Lab Results  Component Value Date   WBC 10.2 02/10/2016   HGB 12.1 02/10/2016   HCT 35.6 (L) 02/10/2016   MCV  87.9 02/10/2016   PLT 180 02/10/2016   CMP Latest Ref Rng & Units 11/27/2014  Glucose 65 - 99 mg/dL 89  BUN 6 - 20 mg/dL 11  Creatinine 5.00 - 9.38 mg/dL 1.82  Sodium 993 - 716 mmol/L 141  Potassium 3.5 - 5.1 mmol/L 3.7  Chloride 101 - 111 mmol/L 109  CO2 22 - 32 mmol/L 23  Calcium 8.9 - 10.3 mg/dL 9.6(V)  Total Protein 6.5 - 8.1 g/dL 7.4  Total Bilirubin 0.3 - 1.2 mg/dL 0.8  Alkaline Phos 38 - 126 U/L 29(L)  AST 15 - 41 U/L 16  ALT 14 - 54 U/L 14    Discharge instruction: per After Visit Summary and "Baby and Me Booklet".  After Visit Meds:    Medication List    TAKE these medications   ibuprofen 600 MG tablet Commonly known as:  ADVIL,MOTRIN Take 1 tablet (600 mg total) by mouth every 6 (six) hours.       Diet: routine diet  Activity: Advance as tolerated. Pelvic rest for 6 weeks.   Outpatient follow up:6 weeks Follow up Appt:No future appointments. Follow up visit: No Follow-up on file.  Postpartum contraception: Undecided  Newborn Data: Live born female  Birth Weight: 6 lb 5.1 oz (2865 g) APGAR: 7, 8  Baby Feeding: Bottle and Breast Disposition:home with mother   02/12/2016 Wyvonnia Dusky, CNM

## 2016-02-16 ENCOUNTER — Encounter: Payer: Self-pay | Admitting: *Deleted

## 2016-02-17 NOTE — Progress Notes (Signed)
Post discharge chart review completed.  

## 2016-02-18 ENCOUNTER — Inpatient Hospital Stay (HOSPITAL_COMMUNITY)
Admission: AD | Admit: 2016-02-18 | Discharge: 2016-02-18 | Disposition: A | Discharge status: Home / Self Care | Payer: Medicaid Other | Source: Ambulatory Visit | Attending: Family Medicine | Admitting: Family Medicine

## 2016-02-18 ENCOUNTER — Encounter (HOSPITAL_COMMUNITY): Payer: Self-pay | Admitting: *Deleted

## 2016-02-18 DIAGNOSIS — F17210 Nicotine dependence, cigarettes, uncomplicated: Secondary | ICD-10-CM | POA: Insufficient documentation

## 2016-02-18 DIAGNOSIS — Z833 Family history of diabetes mellitus: Secondary | ICD-10-CM | POA: Insufficient documentation

## 2016-02-18 DIAGNOSIS — Z8249 Family history of ischemic heart disease and other diseases of the circulatory system: Secondary | ICD-10-CM | POA: Diagnosis not present

## 2016-02-18 DIAGNOSIS — N939 Abnormal uterine and vaginal bleeding, unspecified: Secondary | ICD-10-CM | POA: Diagnosis present

## 2016-02-18 LAB — CBC WITH DIFFERENTIAL/PLATELET
Basophils Absolute: 0 10*3/uL (ref 0.0–0.1)
Basophils Relative: 0 %
EOS ABS: 0.1 10*3/uL (ref 0.0–0.7)
EOS PCT: 2 %
HCT: 39.7 % (ref 36.0–46.0)
HEMOGLOBIN: 13.5 g/dL (ref 12.0–15.0)
LYMPHS ABS: 2.1 10*3/uL (ref 0.7–4.0)
Lymphocytes Relative: 33 %
MCH: 29.9 pg (ref 26.0–34.0)
MCHC: 34 g/dL (ref 30.0–36.0)
MCV: 88 fL (ref 78.0–100.0)
MONOS PCT: 5 %
Monocytes Absolute: 0.3 10*3/uL (ref 0.1–1.0)
Neutro Abs: 3.8 10*3/uL (ref 1.7–7.7)
Neutrophils Relative %: 60 %
PLATELETS: 228 10*3/uL (ref 150–400)
RBC: 4.51 MIL/uL (ref 3.87–5.11)
RDW: 12.7 % (ref 11.5–15.5)
WBC: 6.4 10*3/uL (ref 4.0–10.5)

## 2016-02-18 NOTE — MAU Note (Signed)
Delivered vaginally on 02-10-16; c/o heavy vaginal bleeding; c/o abdominal cramping;

## 2016-02-18 NOTE — Discharge Instructions (Signed)

## 2016-02-18 NOTE — MAU Provider Note (Signed)
Chief Complaint:  Vaginal Bleeding   First Provider Initiated Contact with Patient 02/18/16 1146    Haley Greer is a 35 y.o. [id number] who presents to maternity admissions reporting bleeding, cramping and dizziness today.  Went out today and felt gush of blood.  Had light bleeding yesterday. She reports vaginal bleeding, but no vaginal itching/burning, urinary symptoms, h/a, n/v, or fever/chills.    Vaginal Bleeding  The patient's primary symptoms include pelvic pain and vaginal bleeding. The patient's pertinent negatives include no genital itching or genital odor. This is a recurrent problem. The current episode started in the past 7 days. The problem occurs intermittently. The problem has been unchanged. The pain is moderate. She is not pregnant. Associated symptoms include chills. Pertinent negatives include no constipation, diarrhea, fever, headaches, nausea or vomiting. The vaginal discharge was bloody. The vaginal bleeding is typical of menses. She has not been passing clots. She has not been passing tissue. The symptoms are aggravated by activity. She has tried nothing for the symptoms.    RN Note: Delivered vaginally on 02-10-16; c/o heavy vaginal bleeding; c/o abdominal cramping  Past Medical History: Past Medical History:  Diagnosis Date  . Migraine     Past obstetric history: OB History  Gravida Para Term Preterm AB Living  6 4 4   2 5   SAB TAB Ectopic Multiple Live Births  2     0 4    # Outcome Date GA Lbr Len/2nd Weight Sex Delivery Anes PTL Lv  6 Term 02/10/16 [redacted]w[redacted]d 09:50 / 00:10 6 lb 5.1 oz (2.865 kg) M Vag-Spont EPI  LIV  5 SAB 01/28/15 [redacted]w[redacted]d         4 SAB 06/29/14        FD  3 Term 05/27/11    F Vag-Spont   LIV  2 Term 09/30/06    M Vag-Spont   LIV  1 Term 04/06/03    F Vag-Spont   LIV      Past Surgical History: Past Surgical History:  Procedure Laterality Date  . CHOLECYSTECTOMY      Family History: Family History  Problem Relation Age of Onset   . Diabetes Mother   . Heart disease Father     Social History: Social History  Substance Use Topics  . Smoking status: Current Every Day Smoker    Packs/day: 0.50    Types: Cigarettes    Last attempt to quit: 12/22/2015  . Smokeless tobacco: Current User  . 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 Chloraprep One Step [Chlorhexidine Gluconate] Rash    Meds:  No prescriptions prior to admission.    I have reviewed patient's Past Medical Hx, Surgical Hx, Family Hx, Social Hx, medications and allergies.  ROS:  Review of Systems  Constitutional: Positive for chills. Negative for fever.  Gastrointestinal: Negative for constipation, diarrhea, nausea and vomiting.  Genitourinary: Positive for pelvic pain and vaginal bleeding.  Neurological: Negative for headaches.   Other systems negative     Physical Exam  Patient Vitals for the past 24 hrs:  BP Temp Temp src Pulse Resp  02/18/16 1305 130/87 - - 69 16  02/18/16 1107 119/76 98.5 F (36.9 C) Oral 65 16   Constitutional: Well-developed, well-nourished female in no acute distress.  Cardiovascular: normal rate and rhythm, no ectopy audible, S1 & S2 heard, no murmur Respiratory: normal effort, no distress. Lungs CTAB with  no wheezes or crackles GI: Abd soft, non-tender.  Nondistended.  No rebound, No guarding.  Bowel Sounds audible  MS: Extremities nontender, no edema, normal ROM Neurologic: Alert and oriented x 4.   Grossly nonfocal. GU: Neg CVAT. Skin:  Warm and Dry Psych:  Affect appropriate.  PELVIC EXAM: Cervix pink, visually closed, without lesion, small to moderate dark blood in vault,  vaginal walls and external genitalia normal Bimanual exam: Cervix firm, anterior, neg CMT, uterus nontender, nonenlarged, adnexa without tenderness, enlargement, or mass    Labs: Results for orders placed or performed during the hospital encounter of 02/18/16 (from the past 24  hour(s))  CBC with Differential/Platelet     Status: None   Collection Time: 02/18/16 12:18 PM  Result Value Ref Range   WBC 6.4 4.0 - 10.5 K/uL   RBC 4.51 3.87 - 5.11 MIL/uL   Hemoglobin 13.5 12.0 - 15.0 g/dL   HCT 16.1 09.6 - 04.5 %   MCV 88.0 78.0 - 100.0 fL   MCH 29.9 26.0 - 34.0 pg   MCHC 34.0 30.0 - 36.0 g/dL   RDW 40.9 81.1 - 91.4 %   Platelets 228 150 - 400 K/uL   Neutrophils Relative % 60 %   Neutro Abs 3.8 1.7 - 7.7 K/uL   Lymphocytes Relative 33 %   Lymphs Abs 2.1 0.7 - 4.0 K/uL   Monocytes Relative 5 %   Monocytes Absolute 0.3 0.1 - 1.0 K/uL   Eosinophils Relative 2 %   Eosinophils Absolute 0.1 0.0 - 0.7 K/uL   Basophils Relative 0 %   Basophils Absolute 0.0 0.0 - 0.1 K/uL   --/--/O NEG (07/28 0805)  Imaging:  [place] Mfm Ob Limited  Result Date: 02/08/2016 OBSTETRICAL ULTRASOUND: This exam was performed within a [facility]. The OB US report was generated in the AS system, and faxed to the ordering physician.  This report is available in the [employer]. See the AS Obstetric US report via the Image Link.   MAU Course/MDM: I have ordered labs as follows: CBC Imaging ordered: none Results reviewed.   Discussed Hemoglobin is actually higher, so I am not concerned about blood loss.  Does not need more meds for pain, states not hurting much. No dizziness here now   No tachycardia or suggestion or hemodynamic compromise   Pt stable at time of discharge.  Assessment: 1. Abnormal uterine bleeding, postpartum   2.  Suspect this was temporary fluctuation of lochia due to increased activity  Plan: Discharge home Recommend monitor bleeding Follow up in clinic as scheduled for PP check  Follow-up Information    [facility] .   Why:  Someone will call for appointment Contact information: [address] [place] [address] [phone]           Medication List    TAKE these medications   ibuprofen 600 MG  tablet Commonly known as:  ADVIL,MOTRIN Take 1 tablet (600 mg total) by mouth every 6 (six) hours.      Encouraged to return here or to other Urgent Care/ED if she develops worsening of symptoms, increase in pain, fever, or other concerning symptoms.   Wynelle Bourgeois CNM, MSN Certified Nurse-Midwife 02/18/2016 7:05 PM

## 2016-02-18 NOTE — MAU Note (Signed)
C/o lightheadedness; is breastfeeding and bottlefeeding;

## 2016-03-02 ENCOUNTER — Encounter: Payer: Self-pay | Admitting: *Deleted

## 2016-04-12 ENCOUNTER — Ambulatory Visit: Payer: Medicaid Other | Admitting: Advanced Practice Midwife

## 2016-09-06 ENCOUNTER — Ambulatory Visit (INDEPENDENT_AMBULATORY_CARE_PROVIDER_SITE_OTHER): Payer: Medicaid Other | Admitting: Advanced Practice Midwife

## 2016-09-06 ENCOUNTER — Other Ambulatory Visit (HOSPITAL_COMMUNITY)
Admission: RE | Admit: 2016-09-06 | Discharge: 2016-09-06 | Disposition: A | Discharge status: Home / Self Care | Payer: Medicaid Other | Source: Ambulatory Visit | Attending: Advanced Practice Midwife | Admitting: Advanced Practice Midwife

## 2016-09-06 ENCOUNTER — Telehealth: Payer: Self-pay

## 2016-09-06 VITALS — BP 113/61 | HR 87 | Ht 71.0 in | Wt 213.1 lb

## 2016-09-06 DIAGNOSIS — Z01419 Encounter for gynecological examination (general) (routine) without abnormal findings: Secondary | ICD-10-CM | POA: Diagnosis present

## 2016-09-06 DIAGNOSIS — R102 Pelvic and perineal pain: Secondary | ICD-10-CM | POA: Diagnosis not present

## 2016-09-06 DIAGNOSIS — N941 Unspecified dyspareunia: Secondary | ICD-10-CM | POA: Insufficient documentation

## 2016-09-06 DIAGNOSIS — G8929 Other chronic pain: Secondary | ICD-10-CM

## 2016-09-06 DIAGNOSIS — Z124 Encounter for screening for malignant neoplasm of cervix: Secondary | ICD-10-CM

## 2016-09-06 DIAGNOSIS — Z113 Encounter for screening for infections with a predominantly sexual mode of transmission: Secondary | ICD-10-CM | POA: Insufficient documentation

## 2016-09-06 DIAGNOSIS — Z1151 Encounter for screening for human papillomavirus (HPV): Secondary | ICD-10-CM | POA: Insufficient documentation

## 2016-09-06 MED ORDER — IBUPROFEN 600 MG PO TABS: 600.0000 mg | ORAL_TABLET | Freq: Four times a day (QID) | ORAL | 1 refills | Status: DC | PRN

## 2016-09-06 NOTE — Telephone Encounter (Signed)
Returned call and patient wanted to know appt time, advised [facility]

## 2016-09-06 NOTE — Progress Notes (Signed)
Subjective:     Haley Greer is a 36 y.o. female here for a problem Gyn visit. She reports pain with intercourse x several years but it has become worse recently. She is tearful and reports she cannot be intimate with her husband at all because of her pain. She is not taking any pain medication and does not have pain outside of intercourse.  The pain feels sharp, deep inside her pelvis and is intermittent, only during intercourse.  Nothing makes the pain better.  She also reports she can feel cysts on her cervix that are getting larger.  She reports regular monthly periods since her vaginal delivery 8 months ago.  She has significant pain/cramping on day 1 of her period but otherwise they are normal.    She desires a hysterectomy.     Gynecologic History No LMP recorded (exact date). Contraception: none Last Pap: unsure.  Normal results per pt.     Obstetric History OB History  Gravida Para Term Preterm AB Living  6 4 4   2 5   SAB TAB Ectopic Multiple Live Births  2     0 4    # Outcome Date GA Lbr Len/2nd Weight Sex Delivery Anes PTL Lv  6 Term 02/10/16 [id number] 09:50 / 00:10 6 lb 5.1 oz (2.865 kg) M Vag-Spont EPI  LIV  5 SAB 01/28/15 [id number]         4 SAB 06/29/14        FD  3 Term 05/27/11    F Vag-Spont   LIV  2 Term 09/30/06    M Vag-Spont   LIV  1 Term 04/06/03    F Vag-Spont   LIV       The following portions of the patient's history were reviewed and updated as appropriate: allergies, current medications, past family history, past medical history, past social history, past surgical history and problem list.  Review of Systems Pertinent items noted in HPI and remainder of comprehensive ROS otherwise negative.    Objective:      VS reviewed, nursing note reviewed,  Constitutional: well developed, well nourished, no distress HEENT: normocephalic CV: normal rate Pulm/chest wall: normal effort Abdomen: soft Neuro: alert and oriented x 3 Skin: warm, dry Psych:  affect normal Pelvic exam: Cervix pink, visually closed, without lesion, scant white creamy discharge, vaginal walls and external genitalia normal Bimanual exam: Cervix 0/long/high, several palpable cysts ~0.5cm in diameter, smooth soft, round in cervix that are not visible with SSE,  neg CMT, uterus mildly tender, nonenlarged, adnexa without tenderness, enlargement, or mass    Assessment:         1. Well woman exam  - Cytology - PAP  2. Chronic pelvic pain in female --? Adenomyosis or endometriosis - US Pelvis Complete; Future - US Transvaginal Non-OB; Future - ibuprofen (ADVIL,MOTRIN) 600 MG tablet; Take 1 tablet (600 mg total) by mouth every 6 (six) hours as needed.  Dispense: 30 tablet; Refill: 1  3. Dyspareunia in female --? Adenomyosis or endometriosis - US Pelvis Complete; Future - US Transvaginal Non-OB; Future - ibuprofen (ADVIL,MOTRIN) 600 MG tablet; Take 1 tablet (600 mg total) by mouth every 6 (six) hours as needed.  Dispense: 30 tablet; Refill: 1   Plan:    Outpatient US ordered, pt to f/u with OB/Gyn in 1 month

## 2016-09-10 LAB — CYTOLOGY - PAP
Chlamydia: NEGATIVE
Diagnosis: NEGATIVE
HPV: NOT DETECTED
NEISSERIA GONORRHEA: NEGATIVE

## 2016-09-13 ENCOUNTER — Ambulatory Visit (HOSPITAL_COMMUNITY): Payer: Medicaid Other

## 2016-09-21 ENCOUNTER — Ambulatory Visit (HOSPITAL_COMMUNITY): Payer: Medicaid Other | Attending: Advanced Practice Midwife

## 2016-10-04 ENCOUNTER — Ambulatory Visit: Payer: Medicaid Other | Admitting: Obstetrics & Gynecology

## 2017-03-26 IMAGING — US US OB COMP LESS 14 WK
1 series · 14 of 28 positions shown · non-contrast
Comparison: None.

CLINICAL DATA: Heavy vaginal bleeding today. Pelvic cramping.
History of previous abortion with D and C.

EXAM:
OBSTETRIC <14 WK US AND TRANSVAGINAL OB US
TECHNIQUE: Both transabdominal and transvaginal ultrasound examinations were
performed for complete evaluation of the gestation as well as the
maternal uterus, adnexal regions, and pelvic cul-de-sac.
Transvaginal technique was performed to assess early pregnancy.

[Series 1: us ob comp less 14 wk · 0.24mm/px · 14 of 64 slices shown]
[im 3/64]
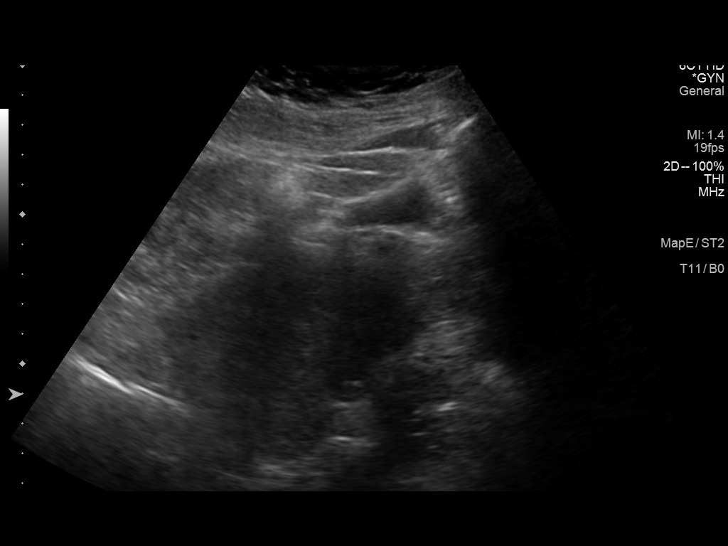
[im 8/64]
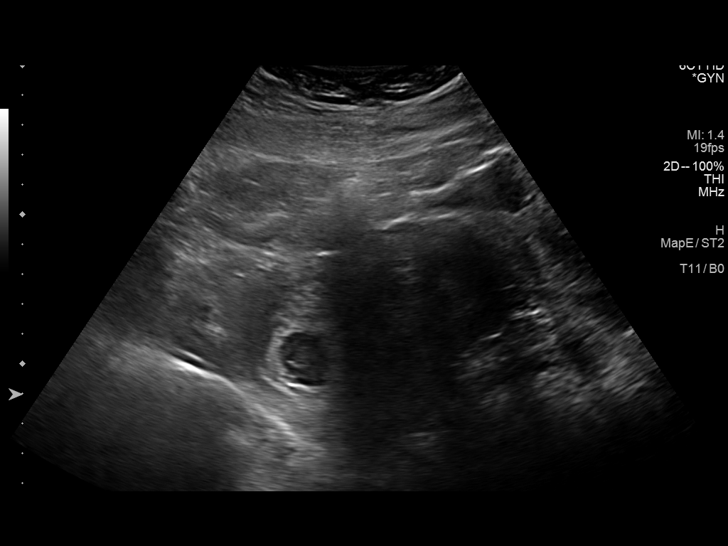
[im 12/64]
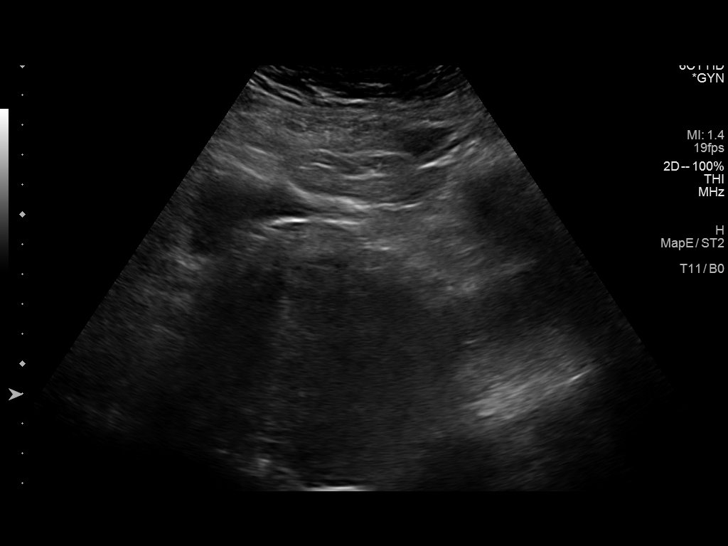
[im 17/64]
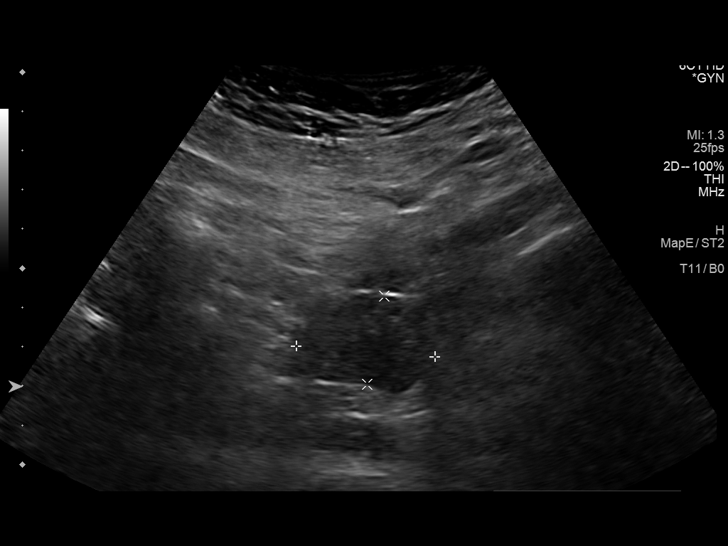
[im 22/64]
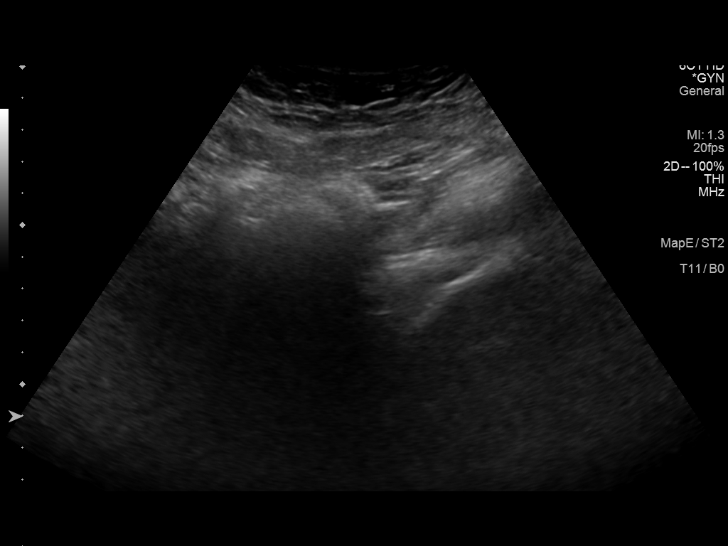
[im 26/64]
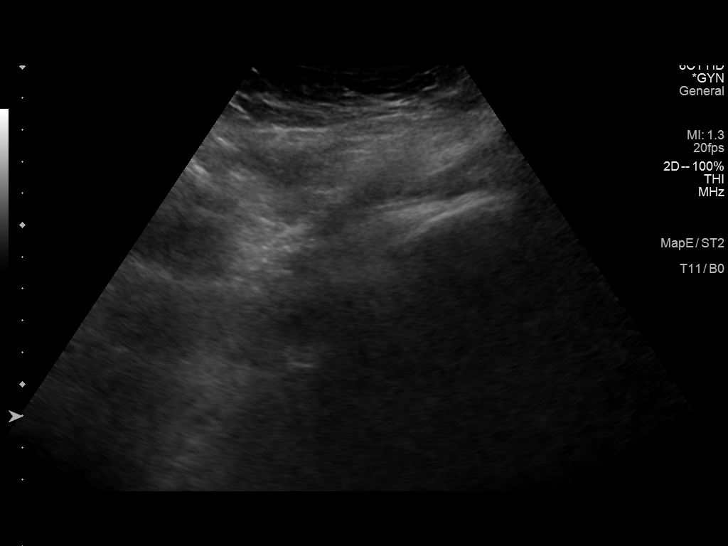
[im 31/64]
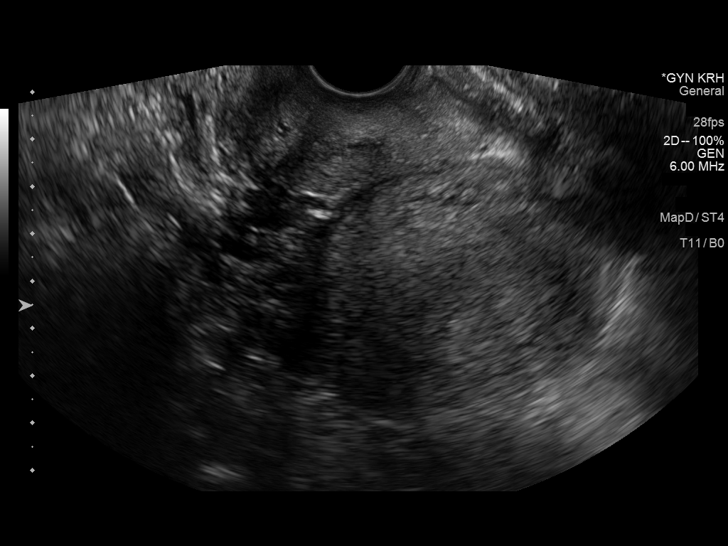
[im 36/64]
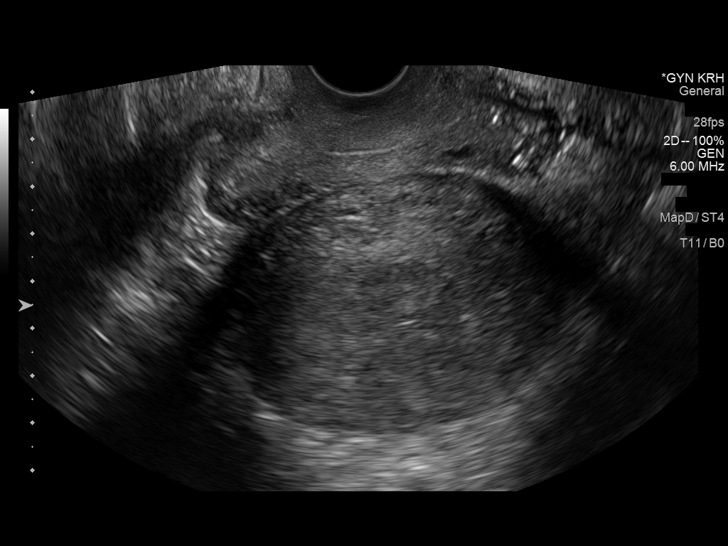
[im 40/64]
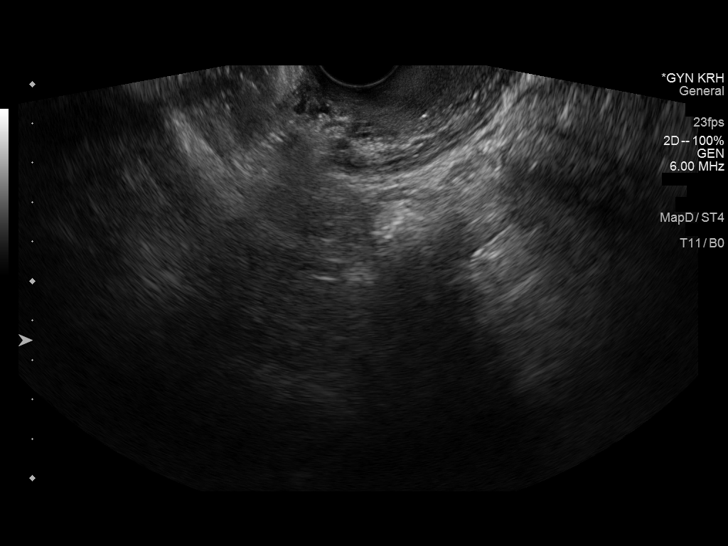
[im 45/64]
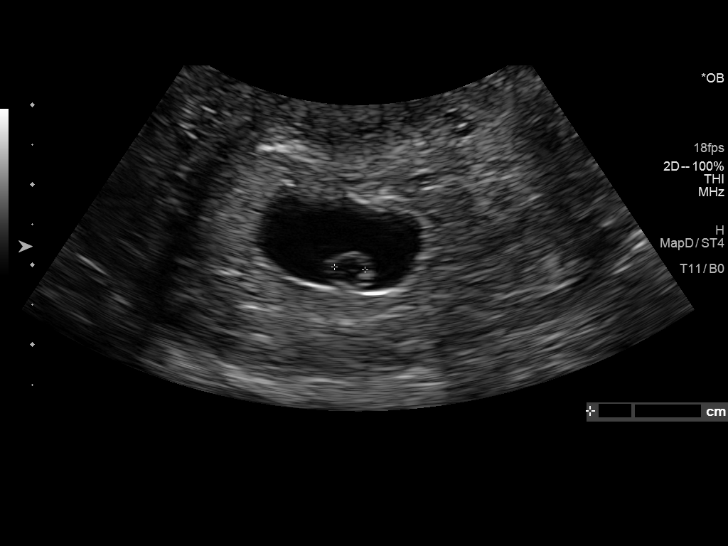
[im 50/64]
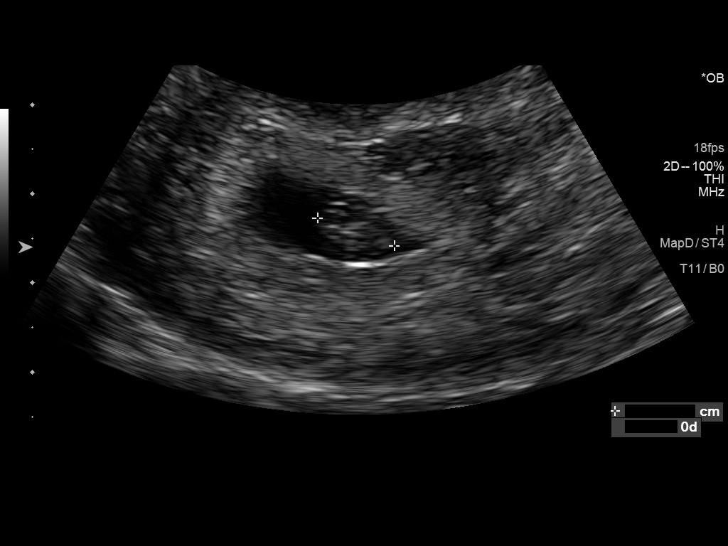
[im 54/64]
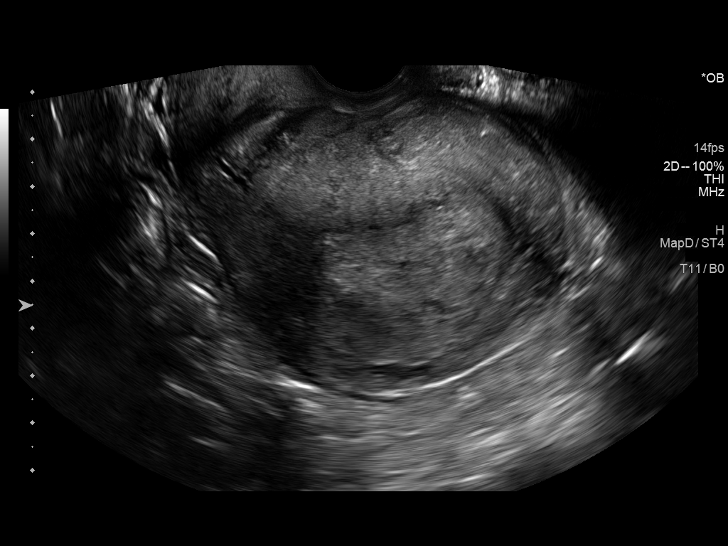
[im 59/64]
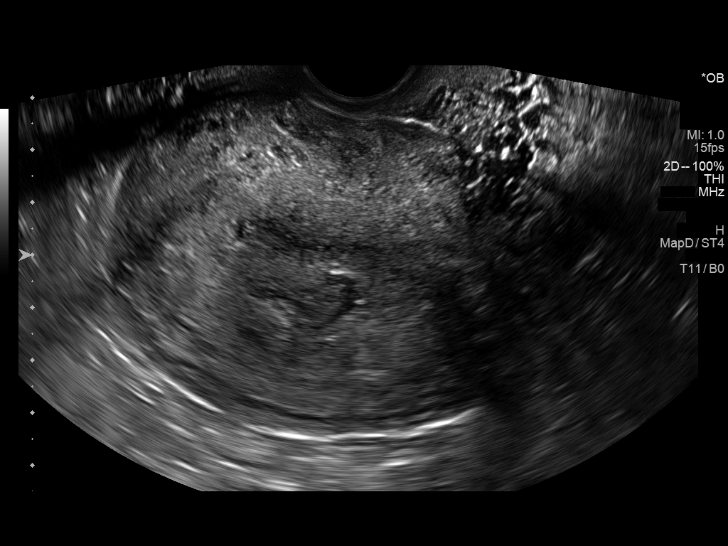
[im 64/64]
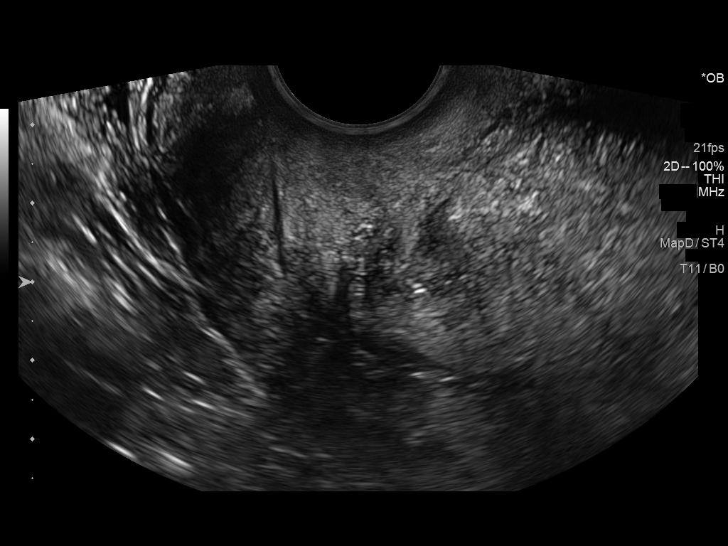

[14 of 28 positions shown; findings below may reference images not displayed]

FINDINGS: Intrauterine gestational sac: Visualized/normal in shape.

Yolk sac:

Embryo:

Cardiac Activity:

Heart Rate:   bpm

CRL:  9  mm   6 w   6 d                  US EDC: 09/04/2015

There is moderate subchorionic hemorrhage.

Maternal uterus/adnexae: The uterus is retroflexed. Small amount of
fluid in the cervical canal. No free fluid. Right ovary measures
x 3.5 x 2.3 cm and appears normal. Left ovary measures 3.2 by 3.7 x
2.5 cm and appears normal.
IMPRESSION: Single living intrauterine pregnancy at 6 weeks 6 days by crown-rump
length. Moderate subchorionic hemorrhage.

## 2017-09-23 ENCOUNTER — Encounter: Payer: Self-pay | Admitting: *Deleted

## 2017-12-13 IMAGING — US US MFM OB FOLLOW-UP
1 series · 12 of 28 positions shown · non-contrast
Comparison: none

[Series 1: us mfm ob follow-up · 0.22mm/px · 12 of 34 slices shown]
[im 2/34]
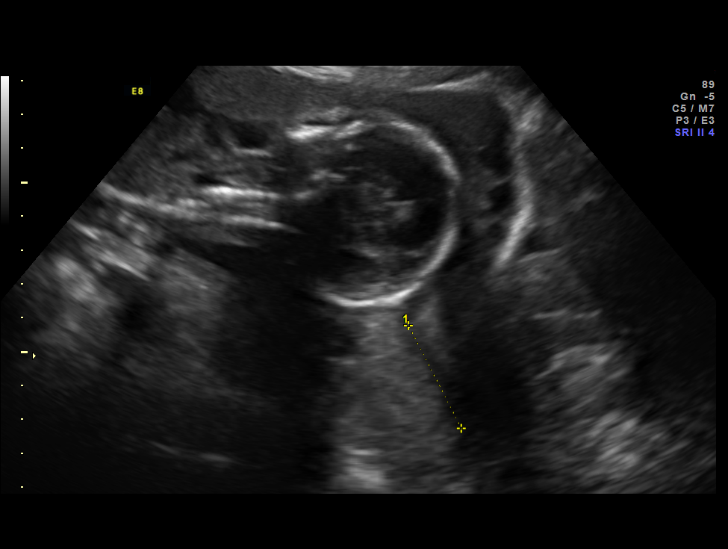
[im 4/34]
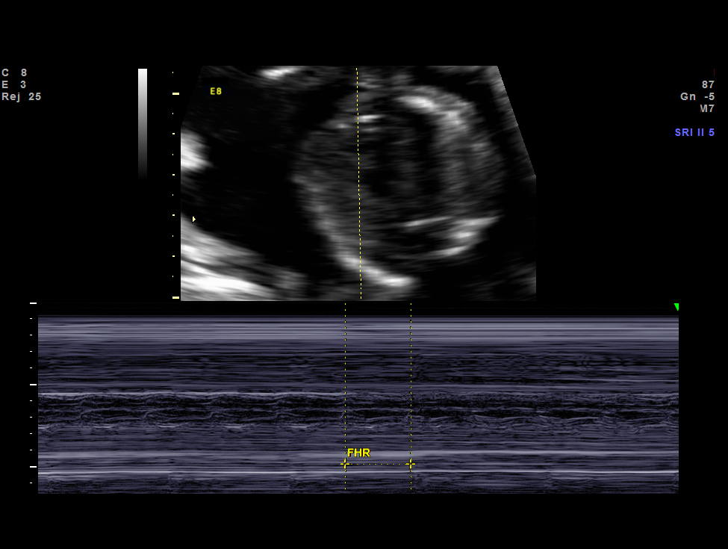
[im 7/34]
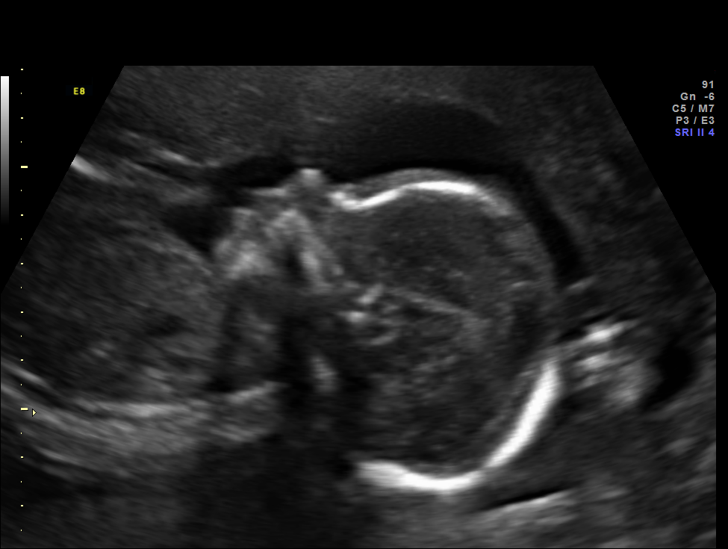
[im 10/34]
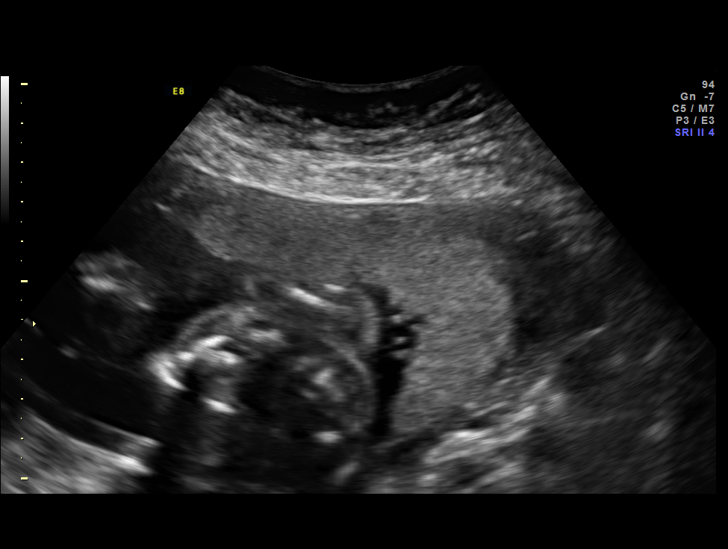
[im 13/34]
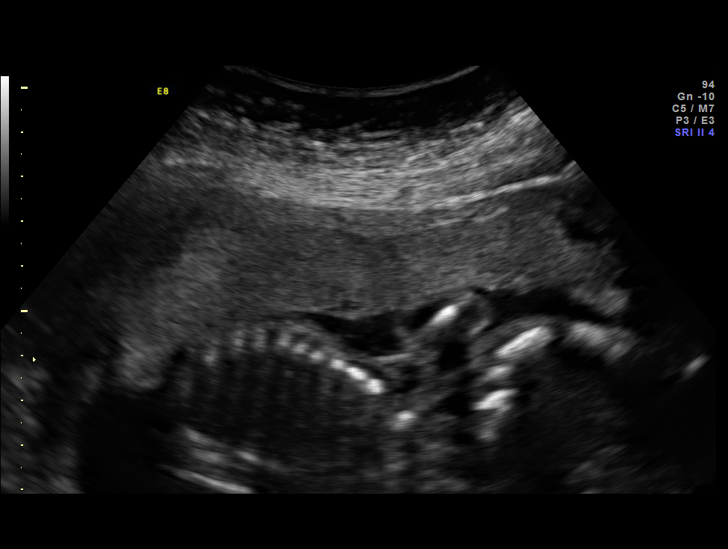
[im 15/34]
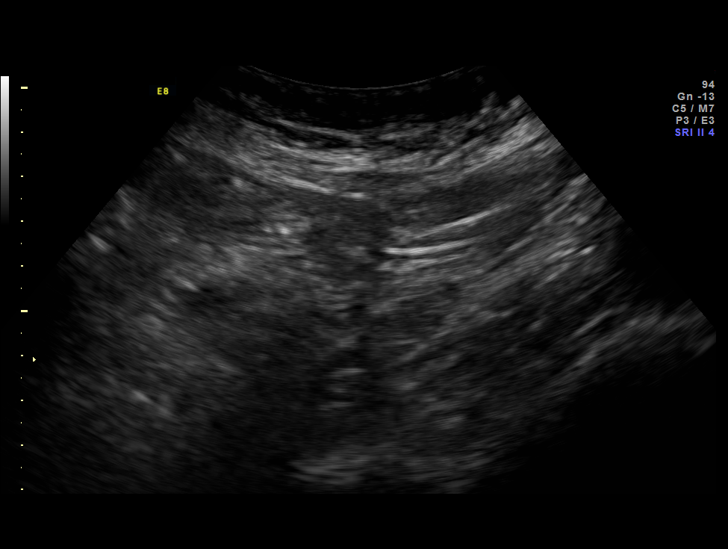
[im 19/34]
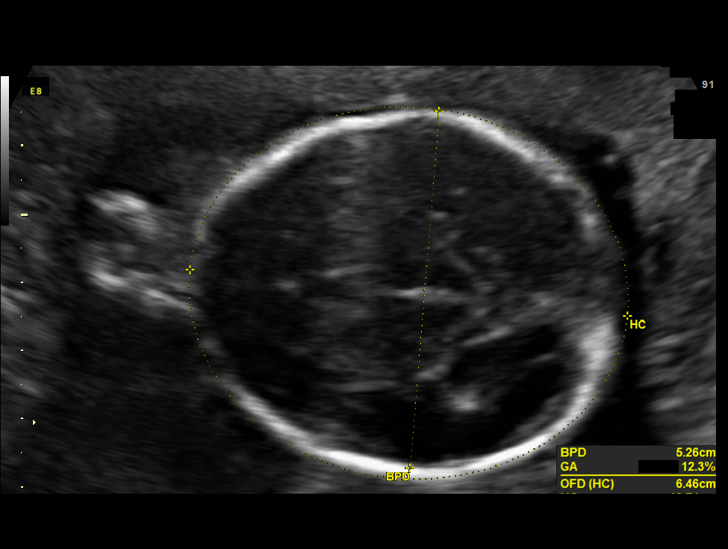
[im 21/34]
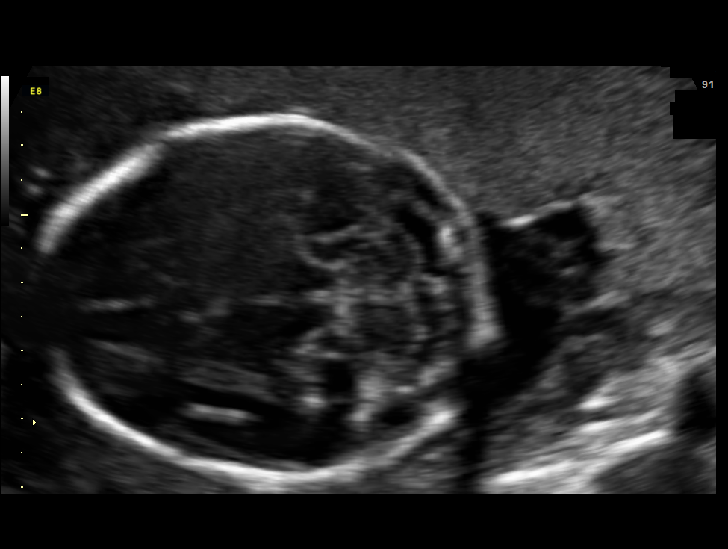
[im 24/34]
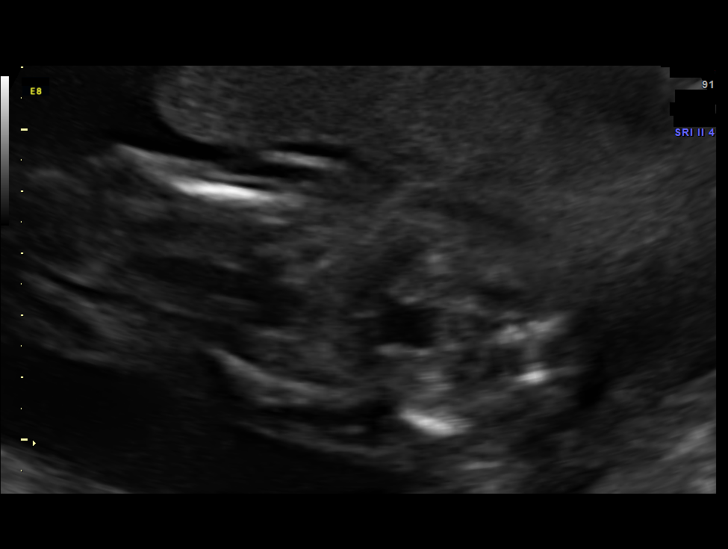
[im 27/34]
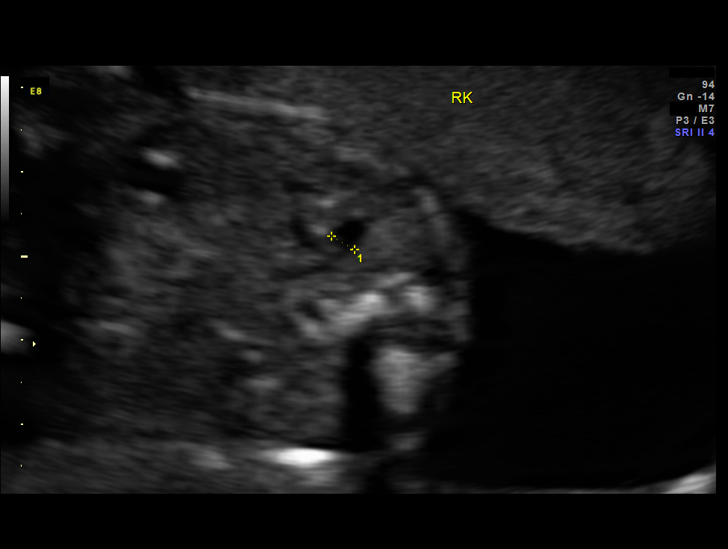
[im 30/34]
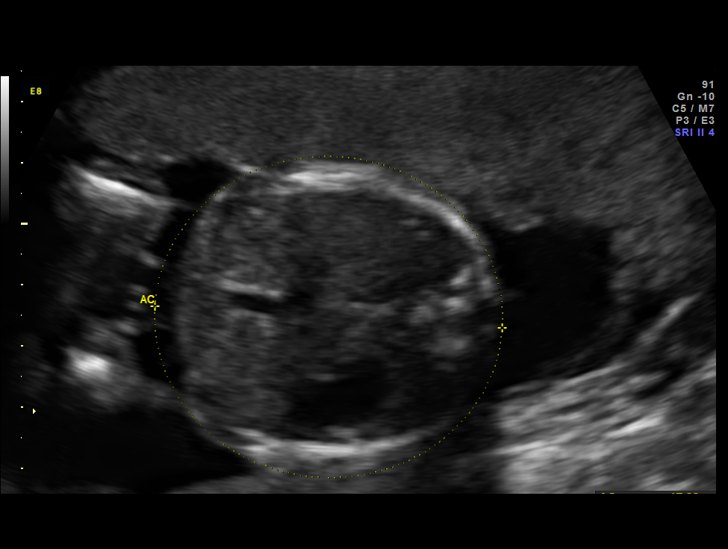
[im 32/34]
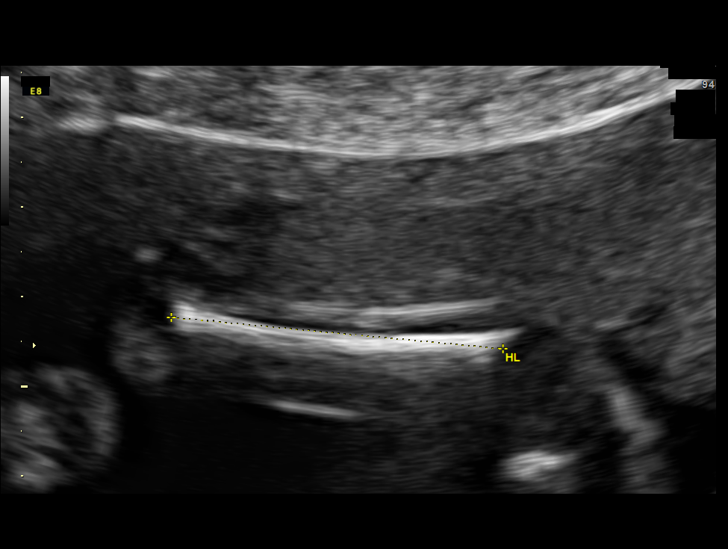

[12 of 28 positions shown; findings below may reference images not displayed]

Hospital Clinic-
Faculty Physician
OB/Gyn Clinic
[REDACTED]. [HOSPITAL],

Indications

23 weeks gestation of pregnancy
Tobacco use complicating pregnancy,
second trimester
Drug use complicating pregnancy, second
trimester  (THC)
OB History

Gravidity:    6         Term:   3        Prem:   0        SAB:   2
TOP:          0       Ectopic:  0        Living: 3
Fetal Evaluation

Num Of Fetuses:     1
Fetal Heart         153
Rate(bpm):
Cardiac Activity:   Observed
Presentation:       Cephalic
Placenta:           Anterior, above cervical os
P. Cord Insertion:  Previously Visualized

Amniotic Fluid
AFI FV:      Subjectively within normal limits
Larg Pckt:     5.4  cm
Biometry

BPD:      52.7  mm     G. Age:  22w 0d                  CI:         80.1   %   70 - 86
OFD:      65.8  mm                                      FL/HC:      19.6   %   19.2 -
HC:       192   mm     G. Age:  21w 4d        < 3  %    HC/AC:      1.12       1.05 -
AC:      170.9  mm     G. Age:  22w 1d         17  %    FL/BPD:     71.3   %   71 - 87
FL:       37.6  mm     G. Age:  22w 0d         13  %    FL/AC:      22.0   %   20 - 24
HUM:      37.1  mm     G. Age:  23w 0d         42  %
CER:      21.8  mm     G. Age:  20w 6d        < 5  %
CM:          3  mm

Est. FW:     466  gm          1 lb      27  %
Gestational Age

LMP:           23w 0d       Date:   04/26/15                 EDD:   01/31/16
U/S Today:     22w 0d                                        EDD:   02/07/16
Best:          23w 0d    Det. By:   LMP  (04/26/15)          EDD:   01/31/16
Anatomy

Cranium:          Appears normal         Aortic Arch:      Previously seen
Fetal Cavum:      Appears normal         Ductal Arch:      Previously seen
Ventricles:       Appears normal         Diaphragm:        Previously seen
Choroid Plexus:   Previously seen        Stomach:          Appears normal, left
sided
Cerebellum:       Appears normal         Abdomen:          Appears normal
Posterior Fossa:  Appears normal         Abdominal Wall:   Previously seen
Nuchal Fold:      Previously seen        Cord Vessels:     Previously seen
Face:             Orbits and profile     Kidneys:          Appear normal
previously seen
Lips:             Previously seen        Bladder:          Appears normal
Fetal Thoracic:   Appears normal         Spine:            Previously seen
Heart:            Appears normal         Upper             Previously seen
(4CH, axis, and        Extremities:
situs)
RVOT:             Previously seen        Lower             Previously seen
Extremities:
LVOT:             Previously seen

Other:  Male gender previously seen. Heels and 5th digit previously
visualized. Nasal bone previously visualized.
Cervix Uterus Adnexa

Cervix
Length:            3.4  cm.
Normal appearance by transabdominal scan.

Left Ovary
Within normal limits.

Right Ovary
Not visualized.

Adnexa:       No abnormality visualized.
Impression

Single IUP at 23w 0d
Normal interval anatomy
Fetal growth is appropriate (27th %tile)
Anterior placenta without previa
Normal amniotic fluid volume
Recommendations

Recommend follow-up ultrasound examination in the 3rd
tirmester (
 30 weeks) for growth

## 2018-04-19 IMAGING — US US MFM OB LIMITED
1 series · 15 of 16 positions shown · non-contrast
Comparison: none

[Series 1: us mfm ob limited · 16 acquisitions, 15 frames shown]
[im 1/16]
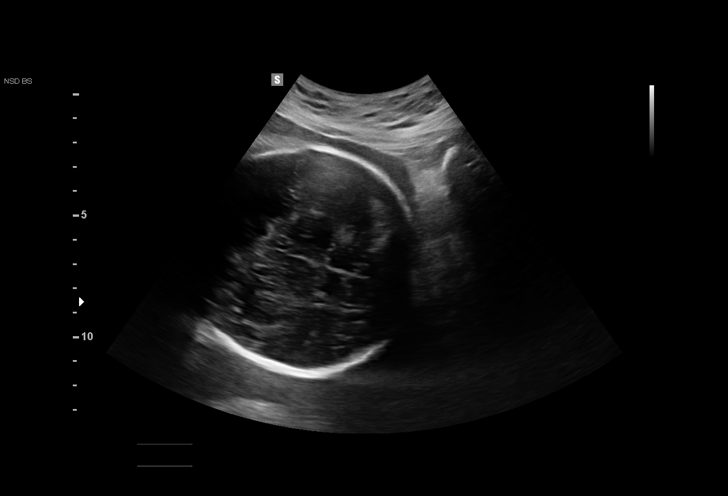
[im 2/16]
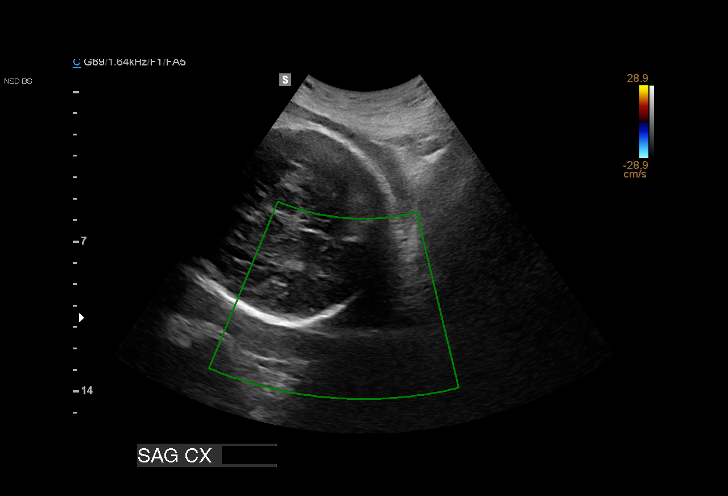
[im 3/16]
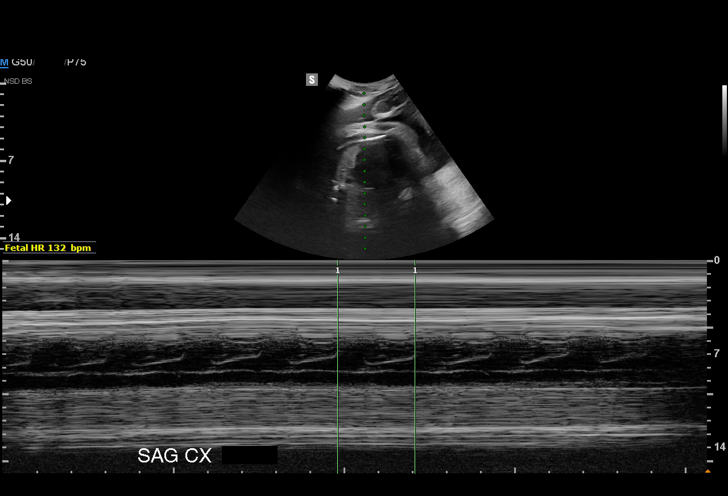
[im 4/16]
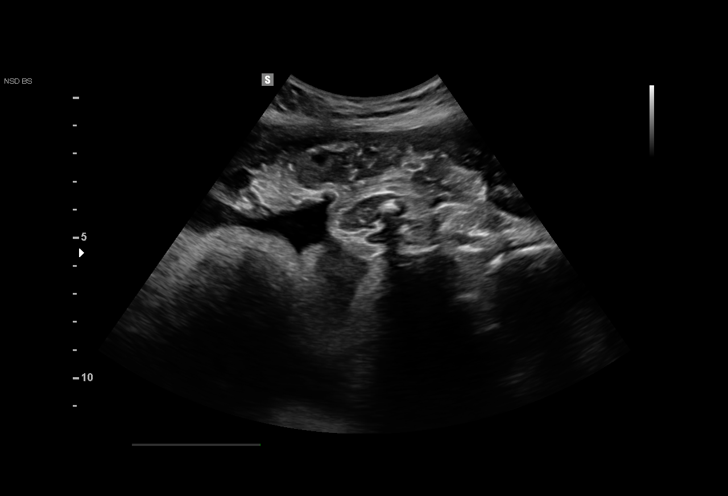
[im 5/16]
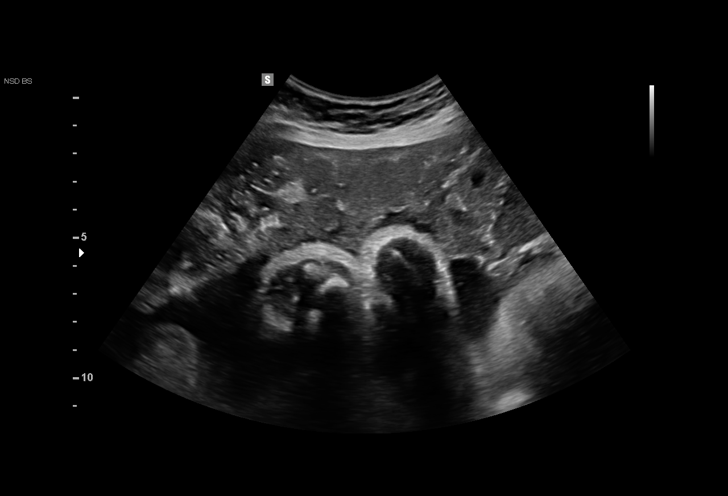
[im 6/16]
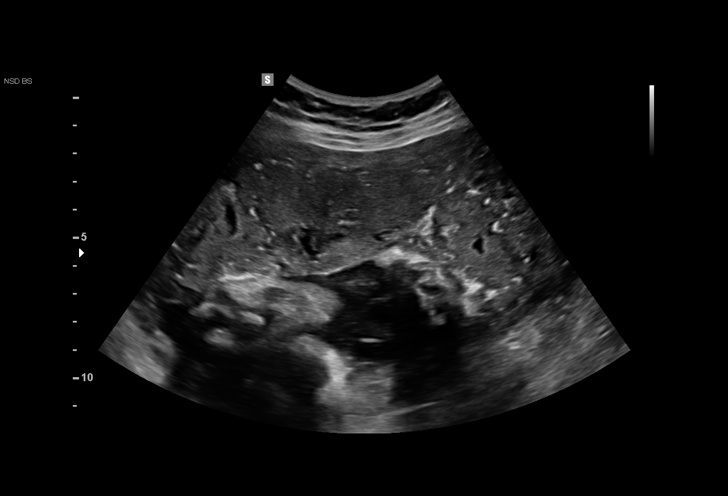
[im 7/16]
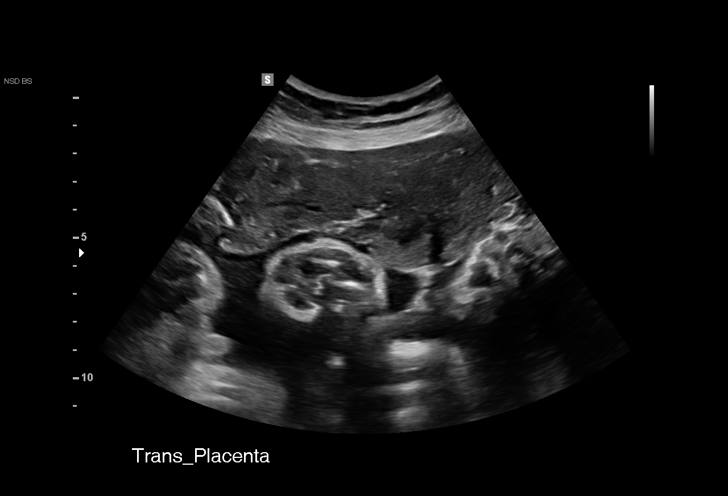
[im 9/16]
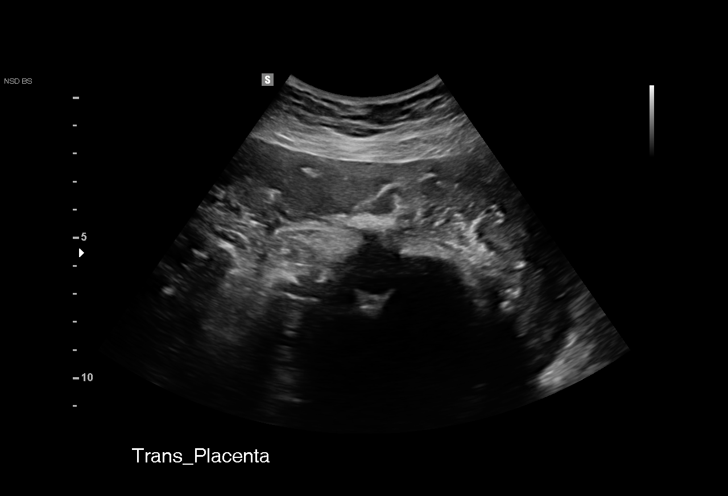
[im 10/16]
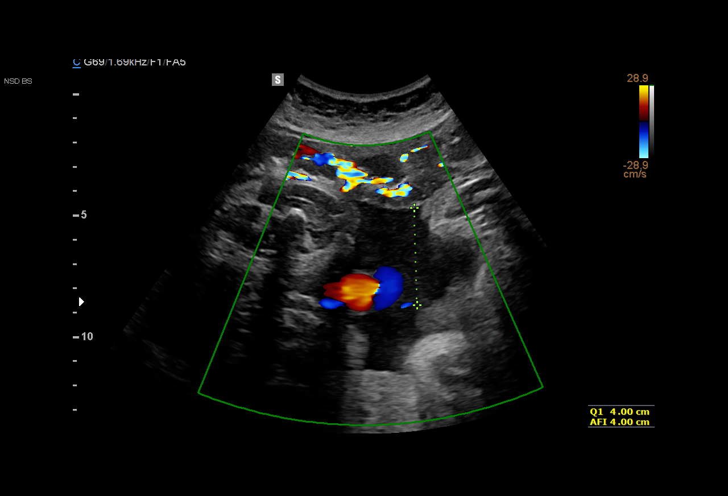
[im 11/16]
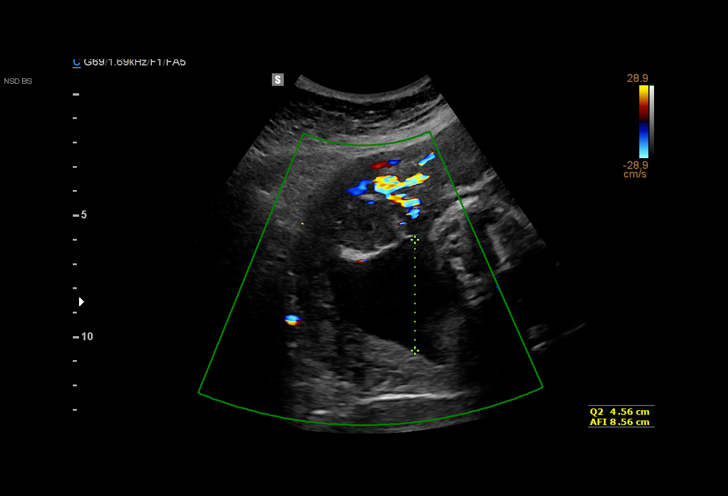
[im 12/16]
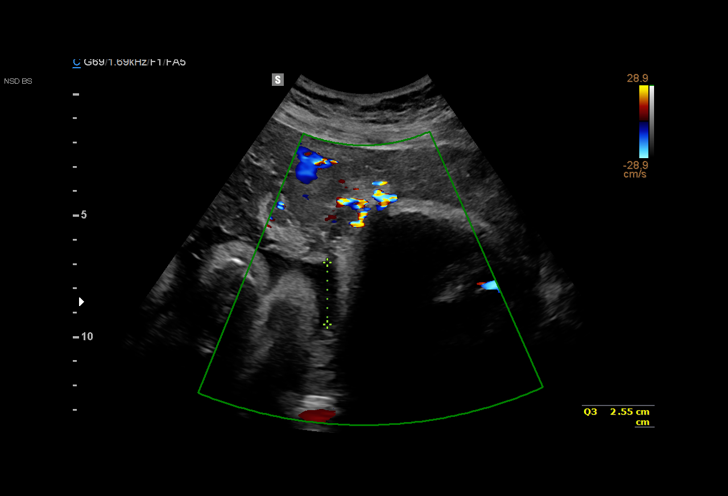
[im 13/16]
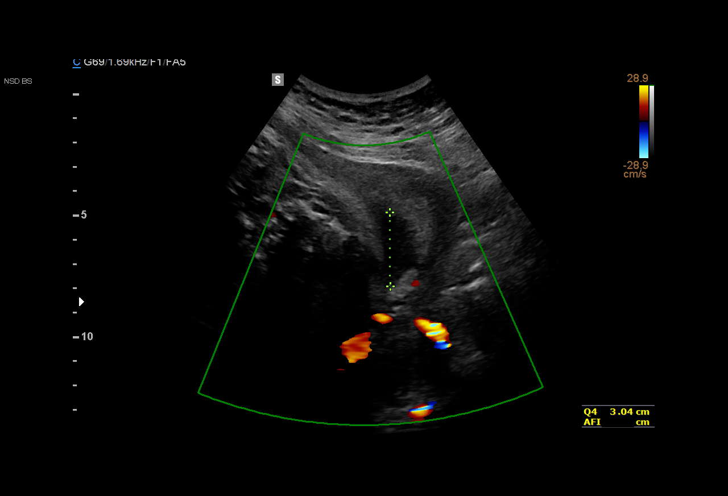
[im 14/16]
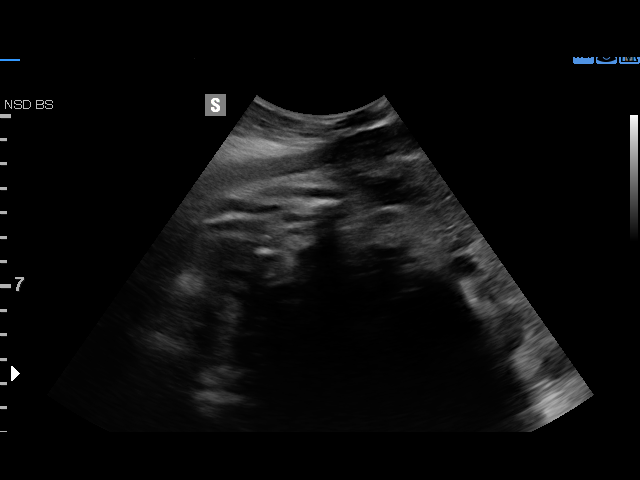
[im 15/16]
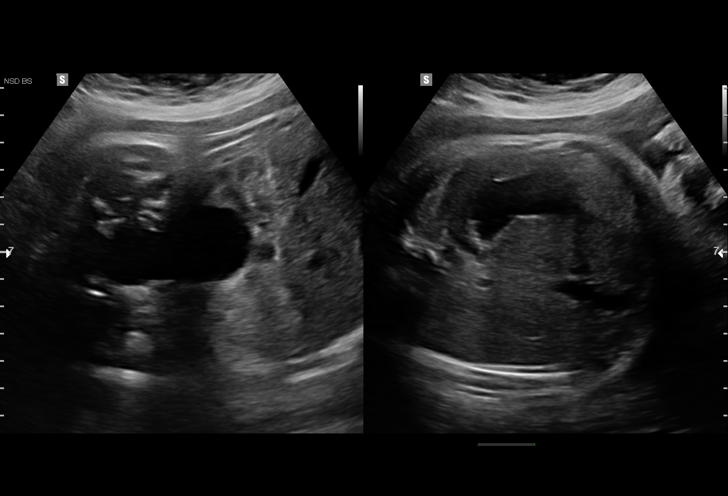
[im 16/16]
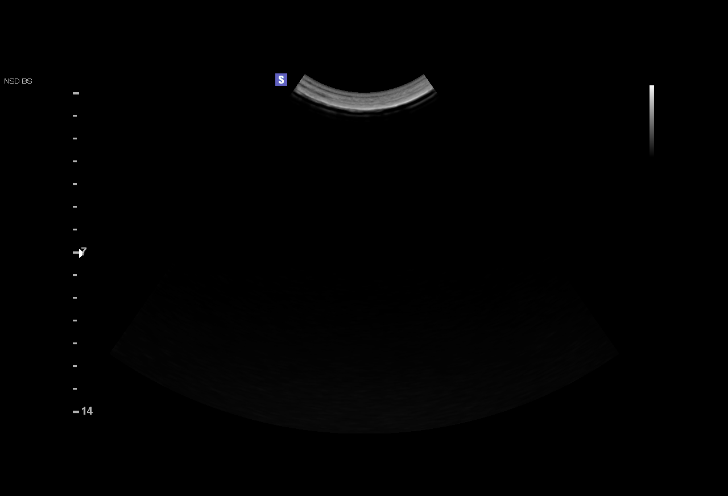

[15 of 16 positions shown; findings below may reference images not displayed]

Hospital Clinic-
Faculty Physician
OB/Gyn Clinic
Ref. Address:     [REDACTED]

[address]          [id number]      [id number]     [id number]
Indications

41 weeks gestation of pregnancy
Postdate pregnancy (40-42 weeks)
Tobacco use complicating pregnancy, third
trimester
Drug use complicating pregnancy, third
trimester (THC)
OB History

Gravidity:    6         Term:   3        Prem:   0        SAB:   2
TOP:          0       Ectopic:  0        Living: 3
Fetal Evaluation

Num Of Fetuses:     1
Fetal Heart         132
Rate(bpm):
Cardiac Activity:   Observed
Presentation:       Cephalic
Placenta:           Anterior, above cervical os
P. Cord Insertion:  Previously Visualized
Amniotic Fluid
AFI FV:      Subjectively within normal limits

AFI Sum(cm)     %Tile       Largest Pocket(cm)
14.15           66

RUQ(cm)       RLQ(cm)       LUQ(cm)        LLQ(cm)
4
Gestational Age

LMP:           41w 1d       Date:   04/26/15                 EDD:   01/31/16
Best:          41w 1d    Det. By:   LMP  (04/26/15)          EDD:   01/31/16
Cervix Uterus Adnexa

Cervix
Not visualized (advanced GA >93wks)
Impression

Single IUP at 41w 1d
Limited ultrasound performed for amniotic fluid volume
assessment
Cephalic presentation
Anterior placenta without previa
Normal amniotic fluid volume (AFI 14.2 cm)
Recommendations

Follow-up ultrasounds as clinically indicated.

## 2020-03-11 ENCOUNTER — Encounter: Payer: Self-pay | Admitting: Obstetrics and Gynecology

## 2020-03-11 ENCOUNTER — Other Ambulatory Visit: Payer: Self-pay

## 2020-03-11 ENCOUNTER — Other Ambulatory Visit (HOSPITAL_COMMUNITY)
Admission: RE | Admit: 2020-03-11 | Discharge: 2020-03-11 | Disposition: A | Discharge status: Home / Self Care | Payer: Medicaid Other | Source: Ambulatory Visit | Attending: Obstetrics and Gynecology | Admitting: Obstetrics and Gynecology

## 2020-03-11 ENCOUNTER — Ambulatory Visit (INDEPENDENT_AMBULATORY_CARE_PROVIDER_SITE_OTHER): Payer: Self-pay | Admitting: Obstetrics and Gynecology

## 2020-03-11 VITALS — BP 116/78 | HR 91 | Wt 199.0 lb

## 2020-03-11 DIAGNOSIS — R102 Pelvic and perineal pain: Secondary | ICD-10-CM

## 2020-03-11 DIAGNOSIS — G8929 Other chronic pain: Secondary | ICD-10-CM

## 2020-03-11 DIAGNOSIS — R1030 Lower abdominal pain, unspecified: Secondary | ICD-10-CM

## 2020-03-11 DIAGNOSIS — N941 Unspecified dyspareunia: Secondary | ICD-10-CM

## 2020-03-11 LAB — POCT URINALYSIS DIP (DEVICE)
Bilirubin Urine: NEGATIVE
Glucose, UA: NEGATIVE mg/dL
Ketones, ur: NEGATIVE mg/dL
Leukocytes,Ua: NEGATIVE
Nitrite: NEGATIVE
Protein, ur: NEGATIVE mg/dL
Specific Gravity, Urine: >=1.03 (ref 1.005–1.030)
Urobilinogen, UA: 0.2 mg/dL (ref 0.0–1.0)
pH: 5.5 (ref 5.0–8.0)

## 2020-03-11 LAB — POCT PREGNANCY, URINE: Preg Test, Ur: NEGATIVE

## 2020-03-11 MED ORDER — IBUPROFEN 600 MG PO TABS: 600.0000 mg | ORAL_TABLET | Freq: Four times a day (QID) | ORAL | 1 refills | Status: AC | PRN

## 2020-03-11 MED ORDER — ACETAMINOPHEN-CODEINE #3 300-30 MG PO TABS: 1.0000 | ORAL_TABLET | Freq: Four times a day (QID) | ORAL | 0 refills | Status: AC | PRN

## 2020-03-11 MED ORDER — ACETAMINOPHEN-CODEINE #3 300-30 MG PO TABS: 1.0000 | ORAL_TABLET | Freq: Four times a day (QID) | ORAL | 0 refills | Status: DC | PRN

## 2020-03-11 NOTE — Progress Notes (Signed)
Obstetrics and Gynecology New Patient Evaluation  Appointment Date: 03/11/2020  OBGYN Clinic: [facility]  Primary Care Provider: None  Referring Provider: Self  Chief Complaint: LL back pain and lower belly pain  History of Present Illness: Haley Greer is a 39 y.o. Caucasian [id number] (Patient's last menstrual period was 02/15/2020.), seen for the above chief complaint. Her past medical history is significant for tobacco abuse  Started 2-3 days ago and started in the LL back and moved to the lower belly and feels like contractions, almost there all the time, no prior history. +occaisonal nausea w/o vomiting.     Review of Systems: Pertinent items noted in HPI and remainder of comprehensive ROS otherwise negative.     Past Medical History:  Past Medical History:  Diagnosis Date  . Migraine     Past Surgical History:  Past Surgical History:  Procedure Laterality Date  . CHOLECYSTECTOMY      Past Obstetrical History:  OB History  Gravida Para Term Preterm AB Living  6 4 4   2 5   SAB TAB Ectopic Multiple Live Births  2     0 4    # Outcome Date GA Lbr Len/2nd Weight Sex Delivery Anes PTL Lv  6 Term 02/10/16 [redacted]w[redacted]d 09:50 / 00:10 6 lb 5.1 oz (2.865 kg) M Vag-Spont EPI  LIV  5 SAB 01/28/15 [redacted]w[redacted]d         4 SAB 06/29/14        FD  3 Term 05/27/11    F Vag-Spont   LIV  2 Term 09/30/06    M Vag-Spont   LIV  1 Term 04/06/03    F Vag-Spont   LIV     Past Gynecological History: As per HPI. Periods: 3-7 days. Heavy and painful. Regular, qmonth History of Pap Smear(s): Yes.   Last pap 2018, which was NILM and HPV neg She is currently using no method for contraception.   Social History:  Social History   Socioeconomic History  . Marital status: Married    Spouse name: Not on file  . Number of children: Not on file  . Years of education: Not on file  . Highest education level: Not on file  Occupational History  . Not on  file  Tobacco Use  . Smoking status: Current Every Day Smoker    Packs/day: 0.50    Types: Cigarettes    Last attempt to quit: 12/22/2015    Years since quitting: 4.2  . Smokeless tobacco: Current User  Substance and Sexual Activity  . Alcohol use: No    Comment: sober x 1 yr  . Drug use: No    Comment: quit using marijuana with pregnancy  . Sexual activity: Not Currently    Birth control/protection: None  Other Topics Concern  . Not on file  Social History Narrative  . Not on file   Social Determinants of Health   Financial Resource Strain:   . Difficulty of Paying Living Expenses: Not on file  Food Insecurity:   . Worried About 02/21/2016 in the Last Year: Not on file  . Ran Out of Food in the Last Year: Not on file  Transportation Needs:   . Lack of Transportation (Medical): Not on file  . Lack of Transportation (Non-Medical): Not on file  Physical Activity:   . Days of Exercise per Week: Not on file  . Minutes of Exercise per Session: Not on file  Stress:   .  Feeling of Stress : Not on file  Social Connections:   . Frequency of Communication with Friends and Family: Not on file  . Frequency of Social Gatherings with Friends and Family: Not on file  . Attends Religious Services: Not on file  . Active Member of Clubs or Organizations: Not on file  . Attends Banker Meetings: Not on file  . Marital Status: Not on file  Intimate Partner Violence:   . Fear of Current or Ex-Partner: Not on file  . Emotionally Abused: Not on file  . Physically Abused: Not on file  . Sexually Abused: Not on file    Family History:  Family History  Problem Relation Age of Onset  . Diabetes Mother   . Heart disease Father    Medications Seini Lannom. Cregan had no medications administered during this visit. Current Outpatient Medications  Medication Sig Dispense Refill  . ibuprofen (ADVIL,MOTRIN) 600 MG tablet Take 1 tablet (600 mg total) by mouth every 6  (six) hours as needed. 30 tablet 1   No current facility-administered medications for this visit.    Allergies Chloraprep one step [chlorhexidine gluconate]   Physical Exam:  BP 116/78   Pulse 91   Wt 199 lb (90.3 kg)   LMP 02/15/2020   BMI 27.75 kg/m  Body mass index is 27.75 kg/m. General appearance: Well nourished, well developed female in no acute distress.  Cardiovascular: normal s1 and s2.  No murmurs, rubs or gallops. Respiratory:  Clear to auscultation bilateral. Normal respiratory effort Abdomen: positive bowel sounds and no masses, hernias; non distended. Mildly ttp in mid low belly, no peritoneal s/s Back: mildly ttp in the left lower back, no cvat Neuro/Psych:  Normal mood and affect.  Skin:  Warm and dry.  Lymphatic:  No inguinal lymphadenopathy.   Pelvic exam: is not limited by body habitus EGBUS: within normal limits Vagina: within normal limits and with no blood or discharge in the vault Cervix: normal appearing cervix without tenderness, discharge or lesions.  Uterus:  nonenlarged and non tender Adnexa:  normal adnexa and no mass, fullness, tenderness Rectovaginal: deferred  Laboratory: UPT negaive. U/a with blood  Radiology: None  Assessment: pt stable  Plan:  1. Lower abdominal pain I told her potential for stone but more likely symptomatic functional cyst but on s/s and timing of cycle. I don't recommend imaging unless s/s persist and aren't better next month or if s/s worsen. UCx sent. Tylenol #3 sent in. [place] database checked - Cervicovaginal ancillary only( [place]) - Urine Culture  Orders Placed This Encounter  Procedures  . Urine Culture    RTC PRN  Cornelia Copa MD Attending Center for [facility])

## 2020-03-12 LAB — URINE CULTURE

## 2020-03-14 LAB — CERVICOVAGINAL ANCILLARY ONLY
Bacterial Vaginitis (gardnerella): NEGATIVE
Candida Glabrata: NEGATIVE
Candida Vaginitis: NEGATIVE
Chlamydia: NEGATIVE
Comment: NEGATIVE
Comment: NEGATIVE
Comment: NEGATIVE
Comment: NEGATIVE
Comment: NEGATIVE
Comment: NORMAL
Neisseria Gonorrhea: NEGATIVE
Trichomonas: NEGATIVE

## 2020-03-17 ENCOUNTER — Ambulatory Visit (HOSPITAL_COMMUNITY): Payer: Self-pay

## 2020-04-04 ENCOUNTER — Ambulatory Visit: Payer: Self-pay | Admitting: Obstetrics and Gynecology

## 2020-04-07 ENCOUNTER — Ambulatory Visit (HOSPITAL_COMMUNITY): Payer: Self-pay

## 2020-04-22 ENCOUNTER — Ambulatory Visit: Payer: Self-pay | Admitting: Obstetrics and Gynecology
